# Patient Record
Sex: Female | Born: 1960 | Race: White | Hispanic: No | Marital: Married | Smoking: Never smoker
Health system: Southern US, Community
[De-identification: ages and names within clinical notes are randomized; demographics above are authoritative.]

## PROBLEM LIST (undated history)

## (undated) DIAGNOSIS — Z9221 Personal history of antineoplastic chemotherapy: Secondary | ICD-10-CM

## (undated) DIAGNOSIS — T7840XA Allergy, unspecified, initial encounter: Secondary | ICD-10-CM

## (undated) DIAGNOSIS — F419 Anxiety disorder, unspecified: Secondary | ICD-10-CM

## (undated) DIAGNOSIS — C7951 Secondary malignant neoplasm of bone: Secondary | ICD-10-CM

## (undated) DIAGNOSIS — F32A Depression, unspecified: Secondary | ICD-10-CM

## (undated) DIAGNOSIS — Z923 Personal history of irradiation: Secondary | ICD-10-CM

## (undated) DIAGNOSIS — C50919 Malignant neoplasm of unspecified site of unspecified female breast: Secondary | ICD-10-CM

## (undated) DIAGNOSIS — J45909 Unspecified asthma, uncomplicated: Secondary | ICD-10-CM

## (undated) DIAGNOSIS — K219 Gastro-esophageal reflux disease without esophagitis: Secondary | ICD-10-CM

## (undated) DIAGNOSIS — R232 Flushing: Secondary | ICD-10-CM

## (undated) DIAGNOSIS — F329 Major depressive disorder, single episode, unspecified: Secondary | ICD-10-CM

## (undated) DIAGNOSIS — M8580 Other specified disorders of bone density and structure, unspecified site: Secondary | ICD-10-CM

---

## 1996-12-04 HISTORY — PX: CHOLECYSTECTOMY: SHX55

## 1997-12-04 DIAGNOSIS — C50919 Malignant neoplasm of unspecified site of unspecified female breast: Secondary | ICD-10-CM

## 1997-12-04 HISTORY — DX: Malignant neoplasm of unspecified site of unspecified female breast: C50.919

## 2003-03-05 DIAGNOSIS — C7951 Secondary malignant neoplasm of bone: Secondary | ICD-10-CM

## 2003-03-05 DIAGNOSIS — C50919 Malignant neoplasm of unspecified site of unspecified female breast: Secondary | ICD-10-CM | POA: Insufficient documentation

## 2003-03-05 HISTORY — DX: Malignant neoplasm of unspecified site of unspecified female breast: C50.919

## 2003-04-23 HISTORY — PX: BREAST LUMPECTOMY: SHX2

## 2013-05-08 ENCOUNTER — Encounter: Payer: Self-pay | Admitting: Radiation Oncology

## 2013-05-08 DIAGNOSIS — C50919 Malignant neoplasm of unspecified site of unspecified female breast: Secondary | ICD-10-CM | POA: Insufficient documentation

## 2013-05-08 DIAGNOSIS — C7951 Secondary malignant neoplasm of bone: Secondary | ICD-10-CM | POA: Insufficient documentation

## 2013-05-08 NOTE — Progress Notes (Signed)
Histology and Location of Primary Cancer:Left Breast stage IV Dx 1999 Sites of Visceral and Bony Metastatic Disease: Sternum 4 / 2004 Location(s) of Symptomatic Metastases:Sternum Past/Anticipated chemotherapy by medical oncology, if any: 1999 Adjuvant Adriamycin and Cyclophosphamide x 4 left breast,Adjuvant Tamoxifen  apprx 2 years,d/c s/e, started on letrozole, after radiation to sternum,,Zometa started 2006,-Trastuzumab  Initiated  And continued 11/2009,was held 12/2009; reinitiated with recovery LVEF 59%,05/2010 Zometa held concerns about atypical fracture risk, 09/2012 Zometa restarted 2ndary to new  Bone fractures noted 9th,10th,ribs, no new mets reported, per 05/07/13 note  Continue to old Zometa ,continued on Trastuzumab every 3 weeks last dose  05/07/13, at [facility],    Radiation to  Left breast  1999 Marcy Panning, she believes [employer], then 2004 radiation to her sternum at [facility], [place]. Patient  has a pass port 8 years right upper inner arm for infusions,   Pain on a scale of 0-10 is: mainly in thoracic back, 4 on  1-10 pain scale,   Occasionally thoughout her body,    If Spine Met(s), symptoms, if any, include: pain upper left back   Bowel/Bladder retention or incontinence (please describe): No  Numbness or weakness in extremities (please describe):No  Current Decadron regimen, if applicable: NO  Ambulatory  SAFETY ISSUES:  Prior radiation? Yes, Left breast 1999, radiation to sternum 03/2003  Pacemaker/ICD? No  Possible current pregnancy? No  Is the patient on methotrexate?No  Current Complaints / other details:Married, no children, referred for possible palliative radiation thoracic spine lesion, mother breast cancer dx 48, living now  Age 52, total mastectomy left breast,   Menses age 1,menopause 71 with Chemotherapy, hot flashes,night sweats for years,  , depression,  Patient  lost her 52 year old cat this 2 am, no sleep  Is very upset

## 2013-05-09 ENCOUNTER — Encounter: Payer: Self-pay | Admitting: Radiation Oncology

## 2013-05-09 ENCOUNTER — Ambulatory Visit
Admission: RE | Admit: 2013-05-09 | Discharge: 2013-05-09 | Disposition: A | Discharge status: Home / Self Care | Payer: BC Managed Care – PPO | Source: Ambulatory Visit | Attending: Radiation Oncology | Admitting: Radiation Oncology

## 2013-05-09 ENCOUNTER — Telehealth: Payer: Self-pay | Admitting: *Deleted

## 2013-05-09 VITALS — Temp 98.5°F | Resp 20 | Wt 141.0 lb

## 2013-05-09 DIAGNOSIS — C7951 Secondary malignant neoplasm of bone: Secondary | ICD-10-CM

## 2013-05-09 DIAGNOSIS — M899 Disorder of bone, unspecified: Secondary | ICD-10-CM | POA: Insufficient documentation

## 2013-05-09 DIAGNOSIS — Z9221 Personal history of antineoplastic chemotherapy: Secondary | ICD-10-CM | POA: Insufficient documentation

## 2013-05-09 DIAGNOSIS — Z9089 Acquired absence of other organs: Secondary | ICD-10-CM | POA: Insufficient documentation

## 2013-05-09 DIAGNOSIS — K219 Gastro-esophageal reflux disease without esophagitis: Secondary | ICD-10-CM | POA: Insufficient documentation

## 2013-05-09 DIAGNOSIS — C50919 Malignant neoplasm of unspecified site of unspecified female breast: Secondary | ICD-10-CM | POA: Insufficient documentation

## 2013-05-09 DIAGNOSIS — Z923 Personal history of irradiation: Secondary | ICD-10-CM | POA: Insufficient documentation

## 2013-05-09 DIAGNOSIS — M549 Dorsalgia, unspecified: Secondary | ICD-10-CM | POA: Insufficient documentation

## 2013-05-09 HISTORY — DX: Malignant neoplasm of unspecified site of unspecified female breast: C50.919

## 2013-05-09 HISTORY — DX: Depression, unspecified: F32.A

## 2013-05-09 HISTORY — DX: Flushing: R23.2

## 2013-05-09 HISTORY — DX: Personal history of antineoplastic chemotherapy: Z92.21

## 2013-05-09 HISTORY — DX: Major depressive disorder, single episode, unspecified: F32.9

## 2013-05-09 HISTORY — DX: Gastro-esophageal reflux disease without esophagitis: K21.9

## 2013-05-09 HISTORY — DX: Unspecified asthma, uncomplicated: J45.909

## 2013-05-09 HISTORY — DX: Anxiety disorder, unspecified: F41.9

## 2013-05-09 HISTORY — DX: Other specified disorders of bone density and structure, unspecified site: M85.80

## 2013-05-09 HISTORY — DX: Personal history of irradiation: Z92.3

## 2013-05-09 HISTORY — DX: Secondary malignant neoplasm of bone: C79.51

## 2013-05-09 HISTORY — DX: Allergy, unspecified, initial encounter: T78.40XA

## 2013-05-09 NOTE — Progress Notes (Signed)
Please see the Nurse Progress Note in the MD Initial Consult Encounter for this patient. 

## 2013-05-09 NOTE — Telephone Encounter (Signed)
CALLED PATIENT TO INFORM OF TEST ON 05-13-13 AT [facility], SPOKE WITH PATIENT AND SHE IS AWARE OF THIS TEST.

## 2013-05-09 NOTE — Progress Notes (Addendum)
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  HISTORY OF PRESENT ILLNESS::Lisa Fisher is a 52 y.o. female  was originally diagnosed with breast cancer in 1999. From outside records the tumor was a T1 C. N0 ER/PR positive HER-2 positive breast cancer. She underwent 6 weeks of radiation and received Adriamycin and cyclophosphamide as well as adjuvant tamoxifen for 2 years. She reports a history of pain when turning her head. She had a bone scan ordered by her primary care physician which showed uptake in her sternum. She had a biopsy of that in April 2004 which confirmed an ER negative PR positive HER-2 positive cancer. She received 10-14 treatments to her sternum and was started on letrozole at that time. She has been maintained on Herceptin and Femara since that time. She was also started on Zometa which has recently been discontinued. She states her sternum continues to be painful. Radiation to the pain from about a 10 to a 3 that she still has constant soreness in that area. She underwent a fall about a year ago which caused rib fractures. She was told this was not due to cancer. For about the past year she has had pain in her mid back between her scapula. This has been worse over the past month and has developed into a constant dull ache. This is painful to palpation. She takes hydrocodone in the morning and ibuprofen throughout the day and basically just ignored it. She reports no focal weakness or numbness of her arms or her legs. She recently moved back to the area with her husband has taken a job Teacher, adult education at [employer]. She established herself the patient at [facility] with Dr. Nancie Neas. Dr. Katy Fitch ordered a bone scan which was performed  on may 27th. This showed focal uptake within the costovertebral junction of the right ninth and 10th rib consistent with fractures. The uptake was noted in the posterior aspect of the 11th rib consistent with fracture and focal increased uptake was seen in the right costovertebral junction representing a sclerotic lesion in the right lamina of T5 fracture versus metastases. There was also uptake in the sternum consistent with metastatic disease. The patient has never been told definitively that she has cancer in any other bone other than the sternum. She reports no pain anywhere else as well as denies any headaches abdominal fullness or weight loss. She has been moving her house over the past month and wonders that the increase in pain in her back is secondary to this.  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    PAST SURGICAL HISTORY: Past Surgical History  Procedure Laterality Date  . Breast lumpectomy Left 04/23/03    metastatic to sternum, with sentinel lymph node, ER/PR=+,Her2=+-invasive  . Cholecystectomy  1998    FAMILY HISTORY:  Family History  Problem Relation Age of Onset  . Cancer Mother 15    breast    SOCIAL HISTORY:  History  Substance Use Topics  . Smoking status: Never Smoker   . Smokeless tobacco: Never  Used  . Alcohol Use: 6.0 oz/week    10 Glasses of wine per week     Comment: weekly    ALLERGIES: Tape  MEDICATIONS:  Current Outpatient Prescriptions  Medication Sig Dispense Refill  . albuterol (PROVENTIL HFA;VENTOLIN HFA) 108 (90 BASE) MCG/ACT inhaler Inhale 2 puffs into the lungs every 4 (four) hours as needed for wheezing.      Marland Kitchen ascorbic acid (VITAMIN C)  500 MG tablet Take 500 mg by mouth daily.      . citalopram (CELEXA) 20 MG tablet Take 20 mg by mouth daily.      Marland Kitchen HYDROcodone-acetaminophen (NORCO) 7.5-325 MG per tablet Take 1 tablet by mouth every 6 (six) hours as needed for pain.      Marland Kitchen letrozole (FEMARA) 2.5 MG tablet Take 2.5 mg by mouth daily.      Marland Kitchen omeprazole (PRILOSEC) 20 MG capsule Take 20 mg by mouth daily.       No current facility-administered medications for this encounter.    REVIEW OF SYSTEMS:  A 15 point review of systems is documented in the electronic medical record. This was obtained by the nursing staff. However, I reviewed this with the patient to discuss relevant findings and make appropriate changes.  Pertinent items are noted in HPI.   PHYSICAL EXAM:  Filed Vitals:   05/09/13 1010  Temp: 98.5 F (36.9 C)  Resp: 20  .141 lb (63.957 kg). He is a pleasant female in no distress sitting comfortably on examining table. She is alert and a x3. She has tenderness to palpation over her mid back. She has tenderness to palpation over the sternum. She is 5 out of 5 strength in her bilateral upper and lower extremities.  LABORATORY DATA:  No results found for this basename: WBC, HGB, HCT, MCV, PLT   No results found for this basename: NA, K, CL, CO2   No results found for this basename: ALT, AST, GGT, ALKPHOS, BILITOT     RADIOGRAPHY: No results found.    IMPRESSION: 52 year old female with a history of a solitary metastases to the sternum with pain and a questionable bone scan finding at T5  PLAN: I talked to the patient today. She is familiar with the effects of radiation and decreasing pain due to metastatic disease. At this point I am not sure that she has metastatic disease. I called radiology at [facility] and asked them to review the bone scan in comparison to her outside studies. It's really unclear what is going on with this lesion at T5. For that reason I have recommended further imaging with an MRI. This  should give [place] an idea as there are metastatic foci in the spine and particularly if this area is associated with a latency. She is debating whether to continue Herceptin or to stop. It she has developed other areas of bony metastatic disease while on Herceptin that could inform her decision on whether to stop or continue Herceptin. It would also make me feel more comfortable in radiating as right now I don't have a clear indication that she has malignancy and therefore don't know that she will respond to radiation treatment to this area. I discussed this with Dr. Lyman Bishop as well as with the patient in great detail. She is scheduled for an MRI of the spine next Tuesday at [facility]. We will followup on those results. She does have malignancy we discussed 10 treatments as an outpatient. We'll of course have to be wary of her  previous treatment to the sternum as well as her breast. Probably look at a posterior wedge pair arrangement.  I spent 40 minutes  face to face with the patient and more than 50% of that time was spent in counseling and/or coordination of care.   ------------------------------------------------  Lurline Hare, MD   ADDENDUM: the MRI of her spine revealed a likely degenerative change at the area of concern at T4 and T5. No other evidence of metastatic disease was noted. In the posterior left fifth rib a 5 mm lesion was present which did enhance with contrast. No correlate was present in the bone scan of may. Trauma to this area could also be an expectation. I also received her outside medical records and have reviewed these. On a bone scan of March of this year uptake was noted at right T5 corresponding to degenerative change. All in all I think we can wean that towards her abnormalities on bone scan being degenerative in nature. There is not a role for radiation in the treatment of degenerative disease. I will contact her medical oncologist. I have not scheduled her followup with  me. Be happy to see her back on a when necessary basis if she requires radiation for pain control or any related issue.

## 2013-05-12 ENCOUNTER — Encounter: Payer: Self-pay | Admitting: Radiation Oncology

## 2013-05-12 NOTE — Progress Notes (Signed)
Per Blue [facility], no precert required for diagnostic imaging procedures for patient's group [phone]

## 2013-05-14 ENCOUNTER — Telehealth: Payer: Self-pay | Admitting: *Deleted

## 2013-05-14 ENCOUNTER — Encounter: Payer: Self-pay | Admitting: *Deleted

## 2013-05-14 NOTE — Telephone Encounter (Signed)
MRI results thoracic spine from [facility] done 05/13/13 printed  And   Results given  to Dr.Wenworth on phone, called patient and levft message for return call 4:02 PM Patient returned call, gave results per Dr.Wentworth no evidence of cancer in her spine, no radiation  To her spine, will continue to monitor her ribs and when MD is back this coming Tuesday she will call Dr. Lyman Bishop, patient thanked MD and this RN for the report 4:06 PM

## 2013-05-20 NOTE — Addendum Note (Signed)
Encounter addended by: Lurline Hare, MD on: 05/20/2013 12:57 PM<BR>     Documentation filed: Visit Diagnoses, Clinical Notes, Notes Section

## 2013-05-21 ENCOUNTER — Telehealth: Payer: Self-pay | Admitting: *Deleted

## 2013-05-21 NOTE — Telephone Encounter (Signed)
Patient called asking if Dr.Wentorth had called Dr.Lawrence and spoke with him and if their is any plans for any treatments? If no cancer then why is she  In pain still, informed her that I would speak with MD today when she is back in office, pateint asked to be called back  11:06 AM

## 2013-05-21 NOTE — Telephone Encounter (Signed)
Pain is likely due to inflammation or musculoskeletal related injury.  Suggest follow up with PCP or possible referral to orthopedic surgeon.  I have emailed Dr. Lyman Bishop but have not heard back. SW

## 2013-05-22 ENCOUNTER — Telehealth: Payer: Self-pay | Admitting: Radiation Oncology

## 2013-05-22 ENCOUNTER — Telehealth: Payer: Self-pay | Admitting: *Deleted

## 2013-05-22 NOTE — Telephone Encounter (Signed)
Discussed MRI with Dr. Lyman Bishop.  Discussed referral to ortho or PCP for pain management. No RT.

## 2013-05-22 NOTE — Telephone Encounter (Signed)
Called patient and relayed information from Dr.Wentworth that pain is likely due to inflammation or musculoskeletal related injury and she should follow up with PCP  Or possible Orthopedic surgeon, Md did e-mail Dr.Lawrence but hasn't heard back as yet, patient thanked MD and me for return call 8:46 AM

## 2013-05-23 ENCOUNTER — Ambulatory Visit: Payer: Self-pay | Admitting: Radiation Oncology

## 2013-05-23 ENCOUNTER — Ambulatory Visit: Payer: Self-pay

## 2016-03-17 DIAGNOSIS — C50112 Malignant neoplasm of central portion of left female breast: Secondary | ICD-10-CM | POA: Diagnosis not present

## 2016-03-17 DIAGNOSIS — Z5111 Encounter for antineoplastic chemotherapy: Secondary | ICD-10-CM | POA: Diagnosis not present

## 2016-04-05 DIAGNOSIS — R918 Other nonspecific abnormal finding of lung field: Secondary | ICD-10-CM | POA: Diagnosis not present

## 2016-04-05 DIAGNOSIS — C50112 Malignant neoplasm of central portion of left female breast: Secondary | ICD-10-CM | POA: Diagnosis not present

## 2016-04-05 DIAGNOSIS — M8928 Other disorders of bone development and growth, other site: Secondary | ICD-10-CM | POA: Diagnosis not present

## 2016-04-07 DIAGNOSIS — Z79899 Other long term (current) drug therapy: Secondary | ICD-10-CM | POA: Diagnosis not present

## 2016-04-07 DIAGNOSIS — C7951 Secondary malignant neoplasm of bone: Secondary | ICD-10-CM | POA: Diagnosis not present

## 2016-04-07 DIAGNOSIS — K5900 Constipation, unspecified: Secondary | ICD-10-CM | POA: Diagnosis not present

## 2016-04-07 DIAGNOSIS — C50112 Malignant neoplasm of central portion of left female breast: Secondary | ICD-10-CM | POA: Diagnosis not present

## 2016-04-07 DIAGNOSIS — Z5111 Encounter for antineoplastic chemotherapy: Secondary | ICD-10-CM | POA: Diagnosis not present

## 2016-04-07 DIAGNOSIS — R0789 Other chest pain: Secondary | ICD-10-CM | POA: Diagnosis not present

## 2016-04-07 DIAGNOSIS — F329 Major depressive disorder, single episode, unspecified: Secondary | ICD-10-CM | POA: Diagnosis not present

## 2016-04-07 DIAGNOSIS — Z79811 Long term (current) use of aromatase inhibitors: Secondary | ICD-10-CM | POA: Diagnosis not present

## 2016-05-05 DIAGNOSIS — C50112 Malignant neoplasm of central portion of left female breast: Secondary | ICD-10-CM | POA: Diagnosis not present

## 2016-05-05 DIAGNOSIS — Z5111 Encounter for antineoplastic chemotherapy: Secondary | ICD-10-CM | POA: Diagnosis not present

## 2016-05-26 DIAGNOSIS — Z5111 Encounter for antineoplastic chemotherapy: Secondary | ICD-10-CM | POA: Diagnosis not present

## 2016-05-26 DIAGNOSIS — C50112 Malignant neoplasm of central portion of left female breast: Secondary | ICD-10-CM | POA: Diagnosis not present

## 2016-06-13 DIAGNOSIS — F329 Major depressive disorder, single episode, unspecified: Secondary | ICD-10-CM | POA: Diagnosis not present

## 2016-06-13 DIAGNOSIS — I10 Essential (primary) hypertension: Secondary | ICD-10-CM | POA: Diagnosis not present

## 2016-06-13 DIAGNOSIS — K5903 Drug induced constipation: Secondary | ICD-10-CM | POA: Diagnosis not present

## 2016-06-13 DIAGNOSIS — T402X5A Adverse effect of other opioids, initial encounter: Secondary | ICD-10-CM | POA: Diagnosis not present

## 2016-06-16 DIAGNOSIS — C50112 Malignant neoplasm of central portion of left female breast: Secondary | ICD-10-CM | POA: Diagnosis not present

## 2016-06-16 DIAGNOSIS — Z5111 Encounter for antineoplastic chemotherapy: Secondary | ICD-10-CM | POA: Diagnosis not present

## 2016-07-05 DIAGNOSIS — I071 Rheumatic tricuspid insufficiency: Secondary | ICD-10-CM | POA: Diagnosis not present

## 2016-07-05 DIAGNOSIS — C50112 Malignant neoplasm of central portion of left female breast: Secondary | ICD-10-CM | POA: Diagnosis not present

## 2016-07-05 DIAGNOSIS — I272 Other secondary pulmonary hypertension: Secondary | ICD-10-CM | POA: Diagnosis not present

## 2016-07-07 DIAGNOSIS — C7951 Secondary malignant neoplasm of bone: Secondary | ICD-10-CM | POA: Diagnosis not present

## 2016-07-07 DIAGNOSIS — Z171 Estrogen receptor negative status [ER-]: Secondary | ICD-10-CM | POA: Diagnosis not present

## 2016-07-07 DIAGNOSIS — C50112 Malignant neoplasm of central portion of left female breast: Secondary | ICD-10-CM | POA: Diagnosis not present

## 2016-07-07 DIAGNOSIS — G893 Neoplasm related pain (acute) (chronic): Secondary | ICD-10-CM | POA: Diagnosis not present

## 2016-07-07 DIAGNOSIS — F329 Major depressive disorder, single episode, unspecified: Secondary | ICD-10-CM | POA: Diagnosis not present

## 2016-07-07 DIAGNOSIS — Z79899 Other long term (current) drug therapy: Secondary | ICD-10-CM | POA: Diagnosis not present

## 2016-07-07 DIAGNOSIS — Z5111 Encounter for antineoplastic chemotherapy: Secondary | ICD-10-CM | POA: Diagnosis not present

## 2016-07-07 DIAGNOSIS — K5900 Constipation, unspecified: Secondary | ICD-10-CM | POA: Diagnosis not present

## 2016-07-07 DIAGNOSIS — Z79811 Long term (current) use of aromatase inhibitors: Secondary | ICD-10-CM | POA: Diagnosis not present

## 2016-07-28 DIAGNOSIS — Z5111 Encounter for antineoplastic chemotherapy: Secondary | ICD-10-CM | POA: Diagnosis not present

## 2016-07-28 DIAGNOSIS — C50112 Malignant neoplasm of central portion of left female breast: Secondary | ICD-10-CM | POA: Diagnosis not present

## 2016-08-18 DIAGNOSIS — C50112 Malignant neoplasm of central portion of left female breast: Secondary | ICD-10-CM | POA: Diagnosis not present

## 2016-08-18 DIAGNOSIS — Z5111 Encounter for antineoplastic chemotherapy: Secondary | ICD-10-CM | POA: Diagnosis not present

## 2016-09-08 DIAGNOSIS — C50112 Malignant neoplasm of central portion of left female breast: Secondary | ICD-10-CM | POA: Diagnosis not present

## 2016-09-08 DIAGNOSIS — C7951 Secondary malignant neoplasm of bone: Secondary | ICD-10-CM | POA: Diagnosis not present

## 2016-09-08 DIAGNOSIS — Z5111 Encounter for antineoplastic chemotherapy: Secondary | ICD-10-CM | POA: Diagnosis not present

## 2016-09-29 DIAGNOSIS — C50112 Malignant neoplasm of central portion of left female breast: Secondary | ICD-10-CM | POA: Diagnosis not present

## 2016-09-29 DIAGNOSIS — Z23 Encounter for immunization: Secondary | ICD-10-CM | POA: Diagnosis not present

## 2016-09-29 DIAGNOSIS — C7951 Secondary malignant neoplasm of bone: Secondary | ICD-10-CM | POA: Diagnosis not present

## 2016-09-29 DIAGNOSIS — Z5111 Encounter for antineoplastic chemotherapy: Secondary | ICD-10-CM | POA: Diagnosis not present

## 2016-10-13 DIAGNOSIS — C7951 Secondary malignant neoplasm of bone: Secondary | ICD-10-CM | POA: Diagnosis not present

## 2016-10-13 DIAGNOSIS — I071 Rheumatic tricuspid insufficiency: Secondary | ICD-10-CM | POA: Diagnosis not present

## 2016-10-13 DIAGNOSIS — I272 Pulmonary hypertension, unspecified: Secondary | ICD-10-CM | POA: Diagnosis not present

## 2016-10-13 DIAGNOSIS — K219 Gastro-esophageal reflux disease without esophagitis: Secondary | ICD-10-CM | POA: Diagnosis not present

## 2016-10-13 DIAGNOSIS — R590 Localized enlarged lymph nodes: Secondary | ICD-10-CM | POA: Diagnosis not present

## 2016-10-13 DIAGNOSIS — C50112 Malignant neoplasm of central portion of left female breast: Secondary | ICD-10-CM | POA: Diagnosis not present

## 2016-10-13 DIAGNOSIS — R918 Other nonspecific abnormal finding of lung field: Secondary | ICD-10-CM | POA: Diagnosis not present

## 2016-10-20 DIAGNOSIS — G893 Neoplasm related pain (acute) (chronic): Secondary | ICD-10-CM | POA: Diagnosis not present

## 2016-10-20 DIAGNOSIS — C50112 Malignant neoplasm of central portion of left female breast: Secondary | ICD-10-CM | POA: Diagnosis not present

## 2016-10-20 DIAGNOSIS — K5900 Constipation, unspecified: Secondary | ICD-10-CM | POA: Diagnosis not present

## 2016-10-20 DIAGNOSIS — Z79899 Other long term (current) drug therapy: Secondary | ICD-10-CM | POA: Diagnosis not present

## 2016-10-20 DIAGNOSIS — F329 Major depressive disorder, single episode, unspecified: Secondary | ICD-10-CM | POA: Diagnosis not present

## 2016-10-20 DIAGNOSIS — Z5111 Encounter for antineoplastic chemotherapy: Secondary | ICD-10-CM | POA: Diagnosis not present

## 2016-10-20 DIAGNOSIS — Z171 Estrogen receptor negative status [ER-]: Secondary | ICD-10-CM | POA: Diagnosis not present

## 2016-10-20 DIAGNOSIS — C7951 Secondary malignant neoplasm of bone: Secondary | ICD-10-CM | POA: Diagnosis not present

## 2016-10-20 DIAGNOSIS — Z79811 Long term (current) use of aromatase inhibitors: Secondary | ICD-10-CM | POA: Diagnosis not present

## 2016-11-10 DIAGNOSIS — Z171 Estrogen receptor negative status [ER-]: Secondary | ICD-10-CM | POA: Diagnosis not present

## 2016-11-10 DIAGNOSIS — Z5111 Encounter for antineoplastic chemotherapy: Secondary | ICD-10-CM | POA: Diagnosis not present

## 2016-11-10 DIAGNOSIS — C50112 Malignant neoplasm of central portion of left female breast: Secondary | ICD-10-CM | POA: Diagnosis not present

## 2016-12-01 DIAGNOSIS — Z171 Estrogen receptor negative status [ER-]: Secondary | ICD-10-CM | POA: Diagnosis not present

## 2016-12-01 DIAGNOSIS — C50112 Malignant neoplasm of central portion of left female breast: Secondary | ICD-10-CM | POA: Diagnosis not present

## 2016-12-01 DIAGNOSIS — Z5111 Encounter for antineoplastic chemotherapy: Secondary | ICD-10-CM | POA: Diagnosis not present

## 2016-12-14 DIAGNOSIS — R0989 Other specified symptoms and signs involving the circulatory and respiratory systems: Secondary | ICD-10-CM | POA: Diagnosis not present

## 2016-12-14 DIAGNOSIS — F329 Major depressive disorder, single episode, unspecified: Secondary | ICD-10-CM | POA: Diagnosis not present

## 2016-12-14 DIAGNOSIS — C50919 Malignant neoplasm of unspecified site of unspecified female breast: Secondary | ICD-10-CM | POA: Diagnosis not present

## 2016-12-14 DIAGNOSIS — I10 Essential (primary) hypertension: Secondary | ICD-10-CM | POA: Diagnosis not present

## 2016-12-22 DIAGNOSIS — Z171 Estrogen receptor negative status [ER-]: Secondary | ICD-10-CM | POA: Diagnosis not present

## 2016-12-22 DIAGNOSIS — Z5112 Encounter for antineoplastic immunotherapy: Secondary | ICD-10-CM | POA: Diagnosis not present

## 2016-12-22 DIAGNOSIS — C50112 Malignant neoplasm of central portion of left female breast: Secondary | ICD-10-CM | POA: Diagnosis not present

## 2017-01-05 DIAGNOSIS — Z171 Estrogen receptor negative status [ER-]: Secondary | ICD-10-CM | POA: Diagnosis not present

## 2017-01-05 DIAGNOSIS — M898X8 Other specified disorders of bone, other site: Secondary | ICD-10-CM | POA: Diagnosis not present

## 2017-01-05 DIAGNOSIS — C50112 Malignant neoplasm of central portion of left female breast: Secondary | ICD-10-CM | POA: Diagnosis not present

## 2017-01-12 DIAGNOSIS — G893 Neoplasm related pain (acute) (chronic): Secondary | ICD-10-CM | POA: Diagnosis not present

## 2017-01-12 DIAGNOSIS — Z171 Estrogen receptor negative status [ER-]: Secondary | ICD-10-CM | POA: Diagnosis not present

## 2017-01-12 DIAGNOSIS — Z5112 Encounter for antineoplastic immunotherapy: Secondary | ICD-10-CM | POA: Diagnosis not present

## 2017-01-12 DIAGNOSIS — C7951 Secondary malignant neoplasm of bone: Secondary | ICD-10-CM | POA: Diagnosis not present

## 2017-01-12 DIAGNOSIS — K5900 Constipation, unspecified: Secondary | ICD-10-CM | POA: Diagnosis not present

## 2017-01-12 DIAGNOSIS — F329 Major depressive disorder, single episode, unspecified: Secondary | ICD-10-CM | POA: Diagnosis not present

## 2017-01-12 DIAGNOSIS — C50912 Malignant neoplasm of unspecified site of left female breast: Secondary | ICD-10-CM | POA: Diagnosis not present

## 2017-01-12 DIAGNOSIS — C50112 Malignant neoplasm of central portion of left female breast: Secondary | ICD-10-CM | POA: Diagnosis not present

## 2017-02-02 DIAGNOSIS — Z170 Estrogen receptor positive status [ER+]: Secondary | ICD-10-CM | POA: Diagnosis not present

## 2017-02-02 DIAGNOSIS — C50112 Malignant neoplasm of central portion of left female breast: Secondary | ICD-10-CM | POA: Diagnosis not present

## 2017-02-02 DIAGNOSIS — Z5112 Encounter for antineoplastic immunotherapy: Secondary | ICD-10-CM | POA: Diagnosis not present

## 2017-02-23 DIAGNOSIS — Z170 Estrogen receptor positive status [ER+]: Secondary | ICD-10-CM | POA: Diagnosis not present

## 2017-02-23 DIAGNOSIS — C50112 Malignant neoplasm of central portion of left female breast: Secondary | ICD-10-CM | POA: Diagnosis not present

## 2017-02-23 DIAGNOSIS — Z5112 Encounter for antineoplastic immunotherapy: Secondary | ICD-10-CM | POA: Diagnosis not present

## 2017-03-16 DIAGNOSIS — C50112 Malignant neoplasm of central portion of left female breast: Secondary | ICD-10-CM | POA: Diagnosis not present

## 2017-03-16 DIAGNOSIS — Z5112 Encounter for antineoplastic immunotherapy: Secondary | ICD-10-CM | POA: Diagnosis not present

## 2017-04-02 DIAGNOSIS — Z171 Estrogen receptor negative status [ER-]: Secondary | ICD-10-CM | POA: Diagnosis not present

## 2017-04-02 DIAGNOSIS — C7951 Secondary malignant neoplasm of bone: Secondary | ICD-10-CM | POA: Diagnosis not present

## 2017-04-02 DIAGNOSIS — C50112 Malignant neoplasm of central portion of left female breast: Secondary | ICD-10-CM | POA: Diagnosis not present

## 2017-04-06 DIAGNOSIS — K5900 Constipation, unspecified: Secondary | ICD-10-CM | POA: Diagnosis not present

## 2017-04-06 DIAGNOSIS — C50112 Malignant neoplasm of central portion of left female breast: Secondary | ICD-10-CM | POA: Diagnosis not present

## 2017-04-06 DIAGNOSIS — G893 Neoplasm related pain (acute) (chronic): Secondary | ICD-10-CM | POA: Diagnosis not present

## 2017-04-06 DIAGNOSIS — F329 Major depressive disorder, single episode, unspecified: Secondary | ICD-10-CM | POA: Diagnosis not present

## 2017-04-06 DIAGNOSIS — C7951 Secondary malignant neoplasm of bone: Secondary | ICD-10-CM | POA: Diagnosis not present

## 2017-04-06 DIAGNOSIS — Z171 Estrogen receptor negative status [ER-]: Secondary | ICD-10-CM | POA: Diagnosis not present

## 2017-04-06 DIAGNOSIS — Z170 Estrogen receptor positive status [ER+]: Secondary | ICD-10-CM | POA: Diagnosis not present

## 2017-04-06 DIAGNOSIS — C50912 Malignant neoplasm of unspecified site of left female breast: Secondary | ICD-10-CM | POA: Diagnosis not present

## 2017-04-16 DIAGNOSIS — Z853 Personal history of malignant neoplasm of breast: Secondary | ICD-10-CM | POA: Diagnosis not present

## 2017-04-16 DIAGNOSIS — C799 Secondary malignant neoplasm of unspecified site: Secondary | ICD-10-CM | POA: Diagnosis not present

## 2017-04-27 DIAGNOSIS — C799 Secondary malignant neoplasm of unspecified site: Secondary | ICD-10-CM | POA: Diagnosis not present

## 2017-04-27 DIAGNOSIS — Z5112 Encounter for antineoplastic immunotherapy: Secondary | ICD-10-CM | POA: Diagnosis not present

## 2017-04-27 DIAGNOSIS — D242 Benign neoplasm of left breast: Secondary | ICD-10-CM | POA: Diagnosis not present

## 2017-04-27 DIAGNOSIS — C50112 Malignant neoplasm of central portion of left female breast: Secondary | ICD-10-CM | POA: Diagnosis not present

## 2017-05-15 DIAGNOSIS — C7951 Secondary malignant neoplasm of bone: Secondary | ICD-10-CM | POA: Diagnosis not present

## 2017-05-18 DIAGNOSIS — Z170 Estrogen receptor positive status [ER+]: Secondary | ICD-10-CM | POA: Diagnosis not present

## 2017-05-18 DIAGNOSIS — C50112 Malignant neoplasm of central portion of left female breast: Secondary | ICD-10-CM | POA: Diagnosis not present

## 2017-05-18 DIAGNOSIS — Z5111 Encounter for antineoplastic chemotherapy: Secondary | ICD-10-CM | POA: Diagnosis not present

## 2017-05-18 DIAGNOSIS — Z79899 Other long term (current) drug therapy: Secondary | ICD-10-CM | POA: Diagnosis not present

## 2017-06-02 DIAGNOSIS — R0789 Other chest pain: Secondary | ICD-10-CM | POA: Diagnosis not present

## 2017-06-02 DIAGNOSIS — C7951 Secondary malignant neoplasm of bone: Secondary | ICD-10-CM | POA: Diagnosis not present

## 2017-06-08 DIAGNOSIS — C7951 Secondary malignant neoplasm of bone: Secondary | ICD-10-CM | POA: Diagnosis not present

## 2017-06-08 DIAGNOSIS — Z5111 Encounter for antineoplastic chemotherapy: Secondary | ICD-10-CM | POA: Diagnosis not present

## 2017-06-08 DIAGNOSIS — Z79899 Other long term (current) drug therapy: Secondary | ICD-10-CM | POA: Diagnosis not present

## 2017-06-08 DIAGNOSIS — G893 Neoplasm related pain (acute) (chronic): Secondary | ICD-10-CM | POA: Diagnosis not present

## 2017-06-08 DIAGNOSIS — Z171 Estrogen receptor negative status [ER-]: Secondary | ICD-10-CM | POA: Diagnosis not present

## 2017-06-08 DIAGNOSIS — C50112 Malignant neoplasm of central portion of left female breast: Secondary | ICD-10-CM | POA: Diagnosis not present

## 2017-06-13 DIAGNOSIS — C792 Secondary malignant neoplasm of skin: Secondary | ICD-10-CM | POA: Diagnosis not present

## 2017-06-13 DIAGNOSIS — C50919 Malignant neoplasm of unspecified site of unspecified female breast: Secondary | ICD-10-CM | POA: Diagnosis not present

## 2017-06-13 DIAGNOSIS — Z923 Personal history of irradiation: Secondary | ICD-10-CM | POA: Diagnosis not present

## 2017-06-13 DIAGNOSIS — Z9889 Other specified postprocedural states: Secondary | ICD-10-CM | POA: Diagnosis not present

## 2017-06-13 DIAGNOSIS — Z171 Estrogen receptor negative status [ER-]: Secondary | ICD-10-CM | POA: Diagnosis not present

## 2017-06-13 DIAGNOSIS — C7951 Secondary malignant neoplasm of bone: Secondary | ICD-10-CM | POA: Diagnosis not present

## 2017-06-18 DIAGNOSIS — R0789 Other chest pain: Secondary | ICD-10-CM | POA: Diagnosis not present

## 2017-06-18 DIAGNOSIS — C7951 Secondary malignant neoplasm of bone: Secondary | ICD-10-CM | POA: Diagnosis not present

## 2017-06-21 DIAGNOSIS — K5903 Drug induced constipation: Secondary | ICD-10-CM | POA: Diagnosis not present

## 2017-06-21 DIAGNOSIS — F329 Major depressive disorder, single episode, unspecified: Secondary | ICD-10-CM | POA: Diagnosis not present

## 2017-06-21 DIAGNOSIS — C50919 Malignant neoplasm of unspecified site of unspecified female breast: Secondary | ICD-10-CM | POA: Diagnosis not present

## 2017-06-21 DIAGNOSIS — I10 Essential (primary) hypertension: Secondary | ICD-10-CM | POA: Diagnosis not present

## 2017-06-25 DIAGNOSIS — C50919 Malignant neoplasm of unspecified site of unspecified female breast: Secondary | ICD-10-CM | POA: Diagnosis not present

## 2017-06-25 DIAGNOSIS — C7951 Secondary malignant neoplasm of bone: Secondary | ICD-10-CM | POA: Diagnosis not present

## 2017-06-26 DIAGNOSIS — R0789 Other chest pain: Secondary | ICD-10-CM | POA: Diagnosis not present

## 2017-06-26 DIAGNOSIS — C7951 Secondary malignant neoplasm of bone: Secondary | ICD-10-CM | POA: Diagnosis not present

## 2017-07-17 DIAGNOSIS — Z5112 Encounter for antineoplastic immunotherapy: Secondary | ICD-10-CM | POA: Diagnosis not present

## 2017-07-17 DIAGNOSIS — T8089XA Other complications following infusion, transfusion and therapeutic injection, initial encounter: Secondary | ICD-10-CM | POA: Diagnosis not present

## 2017-07-17 DIAGNOSIS — C50912 Malignant neoplasm of unspecified site of left female breast: Secondary | ICD-10-CM | POA: Diagnosis not present

## 2017-07-17 DIAGNOSIS — Z5111 Encounter for antineoplastic chemotherapy: Secondary | ICD-10-CM | POA: Diagnosis not present

## 2017-07-17 DIAGNOSIS — R0789 Other chest pain: Secondary | ICD-10-CM | POA: Diagnosis not present

## 2017-07-17 DIAGNOSIS — C7951 Secondary malignant neoplasm of bone: Secondary | ICD-10-CM | POA: Diagnosis not present

## 2017-07-25 DIAGNOSIS — K121 Other forms of stomatitis: Secondary | ICD-10-CM | POA: Diagnosis not present

## 2017-07-26 ENCOUNTER — Encounter: Payer: Self-pay | Admitting: Emergency Medicine

## 2017-07-26 ENCOUNTER — Emergency Department (HOSPITAL_COMMUNITY): Payer: BLUE CROSS/BLUE SHIELD

## 2017-07-26 ENCOUNTER — Inpatient Hospital Stay (HOSPITAL_COMMUNITY)
Admission: EM | Admit: 2017-07-26 | Discharge: 2017-07-28 | DRG: 158 | Disposition: A | Discharge status: Home / Self Care | Payer: BLUE CROSS/BLUE SHIELD | Attending: Family Medicine | Admitting: Family Medicine

## 2017-07-26 DIAGNOSIS — R509 Fever, unspecified: Secondary | ICD-10-CM | POA: Diagnosis not present

## 2017-07-26 DIAGNOSIS — R7881 Bacteremia: Secondary | ICD-10-CM | POA: Diagnosis not present

## 2017-07-26 DIAGNOSIS — B009 Herpesviral infection, unspecified: Secondary | ICD-10-CM | POA: Diagnosis present

## 2017-07-26 DIAGNOSIS — T451X5A Adverse effect of antineoplastic and immunosuppressive drugs, initial encounter: Secondary | ICD-10-CM | POA: Diagnosis present

## 2017-07-26 DIAGNOSIS — Z79899 Other long term (current) drug therapy: Secondary | ICD-10-CM

## 2017-07-26 DIAGNOSIS — Z91048 Other nonmedicinal substance allergy status: Secondary | ICD-10-CM | POA: Diagnosis not present

## 2017-07-26 DIAGNOSIS — J45909 Unspecified asthma, uncomplicated: Secondary | ICD-10-CM | POA: Diagnosis present

## 2017-07-26 DIAGNOSIS — D84821 Immunodeficiency due to drugs: Secondary | ICD-10-CM

## 2017-07-26 DIAGNOSIS — R5081 Fever presenting with conditions classified elsewhere: Secondary | ICD-10-CM | POA: Diagnosis present

## 2017-07-26 DIAGNOSIS — M8580 Other specified disorders of bone density and structure, unspecified site: Secondary | ICD-10-CM | POA: Diagnosis present

## 2017-07-26 DIAGNOSIS — Z9221 Personal history of antineoplastic chemotherapy: Secondary | ICD-10-CM

## 2017-07-26 DIAGNOSIS — D649 Anemia, unspecified: Secondary | ICD-10-CM | POA: Diagnosis not present

## 2017-07-26 DIAGNOSIS — D709 Neutropenia, unspecified: Secondary | ICD-10-CM

## 2017-07-26 DIAGNOSIS — F32A Depression, unspecified: Secondary | ICD-10-CM

## 2017-07-26 DIAGNOSIS — K219 Gastro-esophageal reflux disease without esophagitis: Secondary | ICD-10-CM | POA: Diagnosis not present

## 2017-07-26 DIAGNOSIS — K1231 Oral mucositis (ulcerative) due to antineoplastic therapy: Secondary | ICD-10-CM | POA: Diagnosis not present

## 2017-07-26 DIAGNOSIS — D899 Disorder involving the immune mechanism, unspecified: Secondary | ICD-10-CM | POA: Diagnosis present

## 2017-07-26 DIAGNOSIS — Z170 Estrogen receptor positive status [ER+]: Secondary | ICD-10-CM | POA: Diagnosis not present

## 2017-07-26 DIAGNOSIS — C7951 Secondary malignant neoplasm of bone: Secondary | ICD-10-CM | POA: Diagnosis not present

## 2017-07-26 DIAGNOSIS — C50912 Malignant neoplasm of unspecified site of left female breast: Secondary | ICD-10-CM | POA: Diagnosis not present

## 2017-07-26 DIAGNOSIS — E876 Hypokalemia: Secondary | ICD-10-CM

## 2017-07-26 DIAGNOSIS — F419 Anxiety disorder, unspecified: Secondary | ICD-10-CM | POA: Diagnosis present

## 2017-07-26 DIAGNOSIS — F329 Major depressive disorder, single episode, unspecified: Secondary | ICD-10-CM | POA: Diagnosis present

## 2017-07-26 DIAGNOSIS — Z881 Allergy status to other antibiotic agents status: Secondary | ICD-10-CM

## 2017-07-26 DIAGNOSIS — Z923 Personal history of irradiation: Secondary | ICD-10-CM | POA: Diagnosis not present

## 2017-07-26 DIAGNOSIS — L089 Local infection of the skin and subcutaneous tissue, unspecified: Secondary | ICD-10-CM | POA: Diagnosis not present

## 2017-07-26 DIAGNOSIS — Z886 Allergy status to analgesic agent status: Secondary | ICD-10-CM

## 2017-07-26 DIAGNOSIS — Z79891 Long term (current) use of opiate analgesic: Secondary | ICD-10-CM

## 2017-07-26 DIAGNOSIS — I10 Essential (primary) hypertension: Secondary | ICD-10-CM | POA: Diagnosis present

## 2017-07-26 DIAGNOSIS — Z515 Encounter for palliative care: Secondary | ICD-10-CM | POA: Diagnosis present

## 2017-07-26 DIAGNOSIS — D72819 Decreased white blood cell count, unspecified: Secondary | ICD-10-CM | POA: Diagnosis not present

## 2017-07-26 LAB — URINALYSIS, ROUTINE W REFLEX MICROSCOPIC
BILIRUBIN URINE: NEGATIVE
GLUCOSE, UA: NEGATIVE mg/dL
Hgb urine dipstick: NEGATIVE
KETONES UR: 5 mg/dL — AB
Nitrite: NEGATIVE
PH: 5 (ref 5.0–8.0)
PROTEIN: 30 mg/dL — AB
SPECIFIC GRAVITY, URINE: 1.027 (ref 1.005–1.030)

## 2017-07-26 LAB — COMPREHENSIVE METABOLIC PANEL
ALT: 17 U/L (ref 14–54)
AST: 27 U/L (ref 15–41)
Albumin: 3.9 g/dL (ref 3.5–5.0)
Alkaline Phosphatase: 84 U/L (ref 38–126)
Anion gap: 13 (ref 5–15)
BUN: 13 mg/dL (ref 6–20)
CALCIUM: 8.8 mg/dL — AB (ref 8.9–10.3)
CHLORIDE: 95 mmol/L — AB (ref 101–111)
CO2: 24 mmol/L (ref 22–32)
Creatinine, Ser: 0.6 mg/dL (ref 0.44–1.00)
Glucose, Bld: 106 mg/dL — ABNORMAL HIGH (ref 65–99)
Potassium: 2.7 mmol/L — CL (ref 3.5–5.1)
Sodium: 132 mmol/L — ABNORMAL LOW (ref 135–145)
Total Bilirubin: 0.5 mg/dL (ref 0.3–1.2)
Total Protein: 7.6 g/dL (ref 6.5–8.1)

## 2017-07-26 LAB — BASIC METABOLIC PANEL
Anion gap: 7 (ref 5–15)
BUN: 10 mg/dL (ref 6–20)
CALCIUM: 7.9 mg/dL — AB (ref 8.9–10.3)
CO2: 23 mmol/L (ref 22–32)
Chloride: 102 mmol/L (ref 101–111)
Creatinine, Ser: 0.4 mg/dL — ABNORMAL LOW (ref 0.44–1.00)
GLUCOSE: 146 mg/dL — AB (ref 65–99)
Potassium: 3.2 mmol/L — ABNORMAL LOW (ref 3.5–5.1)
Sodium: 132 mmol/L — ABNORMAL LOW (ref 135–145)

## 2017-07-26 LAB — CBC WITH DIFFERENTIAL/PLATELET
BASOS ABS: 0 10*3/uL (ref 0.0–0.1)
Basophils Relative: 0 %
EOS PCT: 1 %
Eosinophils Absolute: 0 10*3/uL (ref 0.0–0.7)
HEMATOCRIT: 31 % — AB (ref 36.0–46.0)
Hemoglobin: 10.7 g/dL — ABNORMAL LOW (ref 12.0–15.0)
LYMPHS ABS: 0.6 10*3/uL — AB (ref 0.7–4.0)
Lymphocytes Relative: 19 %
MCH: 32.4 pg (ref 26.0–34.0)
MCHC: 34.5 g/dL (ref 30.0–36.0)
MCV: 93.9 fL (ref 78.0–100.0)
METAMYELOCYTES PCT: 1 %
MONOS PCT: 46 %
MYELOCYTES: 10 %
Monocytes Absolute: 1.4 10*3/uL — ABNORMAL HIGH (ref 0.1–1.0)
NRBC: 3 /100{WBCs} — AB
Neutro Abs: 1 10*3/uL — ABNORMAL LOW (ref 1.7–7.7)
Neutrophils Relative %: 23 %
Platelets: 242 10*3/uL (ref 150–400)
RBC: 3.3 MIL/uL — ABNORMAL LOW (ref 3.87–5.11)
RDW: 12.2 % (ref 11.5–15.5)
WBC: 3 10*3/uL — ABNORMAL LOW (ref 4.0–10.5)

## 2017-07-26 LAB — I-STAT CG4 LACTIC ACID, ED
LACTIC ACID, VENOUS: 1.62 mmol/L (ref 0.5–1.9)
LACTIC ACID, VENOUS: 0.94 mmol/L (ref 0.5–1.9)

## 2017-07-26 LAB — MAGNESIUM: MAGNESIUM: 1.5 mg/dL — AB (ref 1.7–2.4)

## 2017-07-26 LAB — PROTIME-INR
INR: 1.01
Prothrombin Time: 13.3 seconds (ref 11.4–15.2)

## 2017-07-26 MED ORDER — POTASSIUM CHLORIDE 10 MEQ/100ML IV SOLN
10.0000 meq | INTRAVENOUS | Status: AC
  Administered 2017-07-26 (×2): 10 meq via INTRAVENOUS
  Filled 2017-07-26 (×2): qty 100

## 2017-07-26 MED ORDER — DEXTROSE 5 % IV SOLN: 2.0000 g | Freq: Three times a day (TID) | INTRAVENOUS | Status: DC

## 2017-07-26 MED ORDER — ONDANSETRON HCL 4 MG PO TABS: 4.0000 mg | ORAL_TABLET | Freq: Four times a day (QID) | ORAL | Status: DC | PRN

## 2017-07-26 MED ORDER — DEXTROSE 5 % IV SOLN
2.0000 g | Freq: Once | INTRAVENOUS | Status: AC
  Administered 2017-07-26: 2 g via INTRAVENOUS
  Filled 2017-07-26: qty 2

## 2017-07-26 MED ORDER — LIDOCAINE-PRILOCAINE 2.5-2.5 % EX CREA: 1.0000 "application " | TOPICAL_CREAM | CUTANEOUS | Status: DC | PRN

## 2017-07-26 MED ORDER — ENOXAPARIN SODIUM 40 MG/0.4ML ~~LOC~~ SOLN
40.0000 mg | SUBCUTANEOUS | Status: DC
  Administered 2017-07-26 – 2017-07-27 (×2): 40 mg via SUBCUTANEOUS
  Filled 2017-07-26 (×2): qty 0.4

## 2017-07-26 MED ORDER — SODIUM CHLORIDE 0.9 % IV BOLUS (SEPSIS)
1000.0000 mL | Freq: Once | INTRAVENOUS | Status: AC
  Administered 2017-07-26: 1000 mL via INTRAVENOUS

## 2017-07-26 MED ORDER — ACETAMINOPHEN 325 MG PO TABS
650.0000 mg | ORAL_TABLET | Freq: Four times a day (QID) | ORAL | Status: DC | PRN
  Administered 2017-07-26 – 2017-07-27 (×3): 650 mg via ORAL
  Filled 2017-07-26 (×3): qty 2

## 2017-07-26 MED ORDER — PIPERACILLIN-TAZOBACTAM 3.375 G IVPB 30 MIN: 3.3750 g | Freq: Four times a day (QID) | INTRAVENOUS | Status: DC

## 2017-07-26 MED ORDER — ACETAMINOPHEN 650 MG RE SUPP: 650.0000 mg | Freq: Four times a day (QID) | RECTAL | Status: DC | PRN

## 2017-07-26 MED ORDER — SODIUM CHLORIDE 0.9 % IV SOLN
Freq: Once | INTRAVENOUS | Status: AC
  Administered 2017-07-26: 13:00:00 via INTRAVENOUS

## 2017-07-26 MED ORDER — PIPERACILLIN-TAZOBACTAM 3.375 G IVPB 30 MIN
3.3750 g | Freq: Once | INTRAVENOUS | Status: AC
  Administered 2017-07-26: 3.375 g via INTRAVENOUS
  Filled 2017-07-26: qty 50

## 2017-07-26 MED ORDER — MAGIC MOUTHWASH
10.0000 mL | Freq: Once | ORAL | Status: AC
  Administered 2017-07-26: 10 mL via ORAL
  Filled 2017-07-26: qty 10

## 2017-07-26 MED ORDER — POTASSIUM CHLORIDE 10 MEQ/50ML IV SOLN: 10.0000 meq | INTRAVENOUS | Status: DC

## 2017-07-26 MED ORDER — DULOXETINE HCL 30 MG PO CPEP
30.0000 mg | ORAL_CAPSULE | Freq: Every day | ORAL | Status: DC
  Administered 2017-07-28: 30 mg via ORAL
  Filled 2017-07-26 (×2): qty 1

## 2017-07-26 MED ORDER — POTASSIUM CHLORIDE IN NACL 40-0.9 MEQ/L-% IV SOLN
INTRAVENOUS | Status: DC
  Administered 2017-07-26: 100 mL/h via INTRAVENOUS
  Filled 2017-07-26 (×2): qty 1000

## 2017-07-26 MED ORDER — PIPERACILLIN-TAZOBACTAM 3.375 G IVPB
3.3750 g | Freq: Three times a day (TID) | INTRAVENOUS | Status: DC
  Administered 2017-07-27 – 2017-07-28 (×4): 3.375 g via INTRAVENOUS
  Filled 2017-07-26 (×2): qty 50

## 2017-07-26 MED ORDER — OXYCODONE HCL 5 MG PO TABS
10.0000 mg | ORAL_TABLET | Freq: Once | ORAL | Status: AC
  Administered 2017-07-26: 10 mg via ORAL
  Filled 2017-07-26: qty 2

## 2017-07-26 MED ORDER — POTASSIUM CHLORIDE 10 MEQ/100ML IV SOLN
10.0000 meq | INTRAVENOUS | Status: AC
  Administered 2017-07-26 – 2017-07-27 (×4): 10 meq via INTRAVENOUS
  Filled 2017-07-26 (×5): qty 100

## 2017-07-26 MED ORDER — MAGIC MOUTHWASH W/LIDOCAINE
5.0000 mL | Freq: Four times a day (QID) | ORAL | Status: DC
  Administered 2017-07-26 – 2017-07-28 (×6): 5 mL via ORAL
  Filled 2017-07-26 (×9): qty 5

## 2017-07-26 MED ORDER — LOSARTAN POTASSIUM 50 MG PO TABS
50.0000 mg | ORAL_TABLET | Freq: Every day | ORAL | Status: DC
  Administered 2017-07-28: 50 mg via ORAL
  Filled 2017-07-26 (×2): qty 1

## 2017-07-26 MED ORDER — POTASSIUM CHLORIDE CRYS ER 20 MEQ PO TBCR
40.0000 meq | EXTENDED_RELEASE_TABLET | Freq: Once | ORAL | Status: AC
  Administered 2017-07-26: 40 meq via ORAL
  Filled 2017-07-26: qty 2

## 2017-07-26 MED ORDER — MAGNESIUM SULFATE 2 GM/50ML IV SOLN
2.0000 g | Freq: Once | INTRAVENOUS | Status: AC
  Administered 2017-07-26: 2 g via INTRAVENOUS
  Filled 2017-07-26: qty 50

## 2017-07-26 MED ORDER — OXYCODONE HCL 5 MG PO TABS
10.0000 mg | ORAL_TABLET | Freq: Four times a day (QID) | ORAL | Status: DC | PRN
  Administered 2017-07-27 – 2017-07-28 (×2): 10 mg via ORAL
  Filled 2017-07-26 (×2): qty 2

## 2017-07-26 MED ORDER — OXYCODONE HCL ER 20 MG PO T12A
20.0000 mg | EXTENDED_RELEASE_TABLET | Freq: Two times a day (BID) | ORAL | Status: DC
  Administered 2017-07-26 – 2017-07-28 (×4): 20 mg via ORAL
  Filled 2017-07-26 (×4): qty 1

## 2017-07-26 MED ORDER — MORPHINE SULFATE (PF) 2 MG/ML IV SOLN
4.0000 mg | Freq: Once | INTRAVENOUS | Status: AC
  Administered 2017-07-26: 4 mg via INTRAVENOUS
  Filled 2017-07-26: qty 2

## 2017-07-26 MED ORDER — SODIUM CHLORIDE 0.9% FLUSH
10.0000 mL | INTRAVENOUS | Status: DC | PRN
  Administered 2017-07-27: 20 mL
  Administered 2017-07-27 – 2017-07-28 (×2): 10 mL
  Filled 2017-07-26 (×3): qty 40

## 2017-07-26 MED ORDER — ONDANSETRON HCL 4 MG/2ML IJ SOLN: 4.0000 mg | Freq: Four times a day (QID) | INTRAMUSCULAR | Status: DC | PRN

## 2017-07-26 NOTE — ED Notes (Signed)
DELAY ON MEDICATIONS. IV TEAM AT BEDSIDE ATTEMPTING TO ACCESS  PASS PORT

## 2017-07-26 NOTE — Progress Notes (Signed)
Called for report 10 mins after patient pended to 3w , couldn't get the nurse.

## 2017-07-26 NOTE — ED Notes (Signed)
Patient transported to X-ray 

## 2017-07-26 NOTE — ED Triage Notes (Signed)
Pt states that she has CA and started a new chemo 8/14 and Sunday she started developing mouth sores and now she has fever. Alert and oriented.

## 2017-07-26 NOTE — ED Notes (Signed)
ED Provider at bedside. 

## 2017-07-26 NOTE — ED Notes (Signed)
Mystie RN MADE AWARE 2 OF 3 POTASSIUM INFUSED.ADMITTING MD STILL WAITING FOR [facility] TO RESPOND. RECEIVING RN TO INFORM PT AND FAMILY.

## 2017-07-26 NOTE — ED Notes (Signed)
BLOOD CULTURE 2ND SET COLLECTED BY MAIN LAB. TIME UNKNOWN

## 2017-07-26 NOTE — ED Notes (Signed)
REQUESTING DEE NT TO ASSIST WITH SECOND BLOOD CULTURE

## 2017-07-26 NOTE — ED Notes (Signed)
ED TO INPATIENT HANDOFF REPORT  Name/Age/Gender Lisa Fisher 56 y.o. female  Code Status    Code Status Orders        Start     Ordered   07/26/17 1654  Full code  Continuous     07/26/17 1657    Code Status History    Date Active Date Inactive Code Status Order ID Comments User Context   This patient has a current code status but no historical code status.      Home/SNF/Other Home  Chief Complaint Mouth Sores  Fever  Cancer Pt  Level of Care/Admitting Diagnosis ED Disposition    ED Disposition Condition Comment   Admit  Hospital Area: [place] [[id number]]  Level of Care: Telemetry [5]  Admit to tele based on following criteria: Other see comments  Comments: K 2.7  Diagnosis: Neutropenic fever [facility]) [[id number]]  Admitting Physician: Vashti Hey [[id number]]  Attending Physician: Vashti Hey [[id number]]  Estimated length of stay: past midnight tomorrow  Certification:: I certify this patient will need inpatient services for at least 2 midnights  PT Class (Do Not Modify): Inpatient [101]  PT Acc Code (Do Not Modify): Private [1]       Medical History Past Medical History:  Diagnosis Date  . Allergy   . Anxiety   . Asthma   . Breast cancer ([place]) 1999    left   . Breast cancer metastasized to bone ([place]) 03/2003   stage IV mets to sternum  . Depression   . GERD (gastroesophageal reflux disease)   . History of chemotherapy    hx: adjuvant Adriamycin and cyclphosphamide x4,, tamoxifen 2 y,,zometa ,trastuzumab  . History of radiation therapy    to sternum  . Hot flashes    due to letrozole  . 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 Osteopenia     Allergies Allergies  Allergen Reactions  . Levofloxacin Other (See Comments)    Tendonitis  . Naproxen Rash  . Tape Rash    Adhesive tape allergy,paper tape ok    IV Location/Drains/Wounds Patient Lines/Drains/Airways Status   Active  Line/Drains/Airways    Name:   Placement date:   Placement time:   Site:   Days:   Implanted Port 06/03/05 Right   06/03/05            4436   Peripheral IV 07/26/17 Right Forearm  07/26/17    1210    Forearm    less than 1          Labs/Imaging Results for orders placed or performed during the hospital encounter of 07/26/17 (from the past 48 hour(s))  Urinalysis, Routine w reflex microscopic     Status: Abnormal   Collection Time: 07/26/17 11:50 AM  Result Value Ref Range   Color, Urine AMBER (A) YELLOW    Comment: BIOCHEMICALS MAY BE AFFECTED BY COLOR   APPearance HAZY (A) CLEAR   Specific Gravity, Urine 1.027 1.005 - 1.030   pH 5.0 5.0 - 8.0   Glucose, UA NEGATIVE NEGATIVE mg/dL   Hgb urine dipstick NEGATIVE NEGATIVE   Bilirubin Urine NEGATIVE NEGATIVE   Ketones, ur 5 (A) NEGATIVE mg/dL   Protein, ur 30 (A) NEGATIVE mg/dL   Nitrite NEGATIVE NEGATIVE   Leukocytes, UA MODERATE (A) NEGATIVE   RBC / HPF 0-5 0 - 5 RBC/hpf   WBC, UA 6-30 0 - 5 WBC/hpf   Bacteria, UA RARE (A) NONE SEEN  Squamous Epithelial / LPF 0-5 (A) NONE SEEN   Mucus PRESENT    Hyaline Casts, UA PRESENT   Comprehensive metabolic panel     Status: Abnormal   Collection Time: 07/26/17 12:07 PM  Result Value Ref Range   Sodium 132 (L) 135 - 145 mmol/L   Potassium 2.7 (LL) 3.5 - 5.1 mmol/L    Comment: CRITICAL RESULT CALLED TO, READ BACK BY AND VERIFIED WITH: TALKINGTON,J @ 1319 ON [id number] BY POTEAT,S    Chloride 95 (L) 101 - 111 mmol/L   CO2 24 22 - 32 mmol/L   Glucose, Bld 106 (H) 65 - 99 mg/dL   BUN 13 6 - 20 mg/dL   Creatinine, Ser 0.60 0.44 - 1.00 mg/dL   Calcium 8.8 (L) 8.9 - 10.3 mg/dL   Total Protein 7.6 6.5 - 8.1 g/dL   Albumin 3.9 3.5 - 5.0 g/dL   AST 27 15 - 41 U/L   ALT 17 14 - 54 U/L   Alkaline Phosphatase 84 38 - 126 U/L   Total Bilirubin 0.5 0.3 - 1.2 mg/dL   GFR calc non Af Amer >60 >60 mL/min   GFR calc Af Amer >60 >60 mL/min    Comment: (NOTE) The eGFR has been calculated using the  CKD EPI equation. This calculation has not been validated in all clinical situations. eGFR's persistently <60 mL/min signify possible Chronic Kidney Disease.    Anion gap 13 5 - 15  CBC with Differential     Status: Abnormal   Collection Time: 07/26/17 12:07 PM  Result Value Ref Range   WBC 3.0 (L) 4.0 - 10.5 K/uL   RBC 3.30 (L) 3.87 - 5.11 MIL/uL   Hemoglobin 10.7 (L) 12.0 - 15.0 g/dL   HCT 31.0 (L) 36.0 - 46.0 %   MCV 93.9 78.0 - 100.0 fL   MCH 32.4 26.0 - 34.0 pg   MCHC 34.5 30.0 - 36.0 g/dL   RDW 12.2 11.5 - 15.5 %   Platelets 242 150 - 400 K/uL   Neutrophils Relative % 23 %   Lymphocytes Relative 19 %   Monocytes Relative 46 %   Eosinophils Relative 1 %   Basophils Relative 0 %   Metamyelocytes Relative 1 %   Myelocytes 10 %   nRBC 3 (H) 0 /100 WBC   Neutro Abs 1.0 (L) 1.7 - 7.7 K/uL   Lymphs Abs 0.6 (L) 0.7 - 4.0 K/uL   Monocytes Absolute 1.4 (H) 0.1 - 1.0 K/uL   Eosinophils Absolute 0.0 0.0 - 0.7 K/uL   Basophils Absolute 0.0 0.0 - 0.1 K/uL   RBC Morphology POLYCHROMASIA PRESENT     Comment: RARE NRBCs   WBC Morphology      MODERATE LEFT SHIFT (>5% METAS AND MYELOS,OCC PRO NOTED)    Comment: TOXIC GRANULATION DOHLE BODIES   Protime-INR     Status: None   Collection Time: 07/26/17 12:07 PM  Result Value Ref Range   Prothrombin Time 13.3 11.4 - 15.2 seconds   INR 1.01   Magnesium     Status: Abnormal   Collection Time: 07/26/17 12:07 PM  Result Value Ref Range   Magnesium 1.5 (L) 1.7 - 2.4 mg/dL  I-Stat CG4 Lactic Acid, ED     Status: None   Collection Time: 07/26/17 12:15 PM  Result Value Ref Range   Lactic Acid, Venous 1.62 0.5 - 1.9 mmol/L  I-Stat CG4 Lactic Acid, ED     Status: None   Collection Time: 07/26/17  2:22 PM  Result Value Ref Range   Lactic Acid, Venous 0.94 0.5 - 1.9 mmol/L   Dg Chest 2 View  Result Date: 07/26/2017 CLINICAL DATA:  Fever.  Chemotherapy. EXAM: CHEST  2 VIEW COMPARISON:  None. FINDINGS: There is a right-sided line  terminating in the central SVC. The heart, hila, and mediastinum are normal. No pneumothorax. No nodules or masses. No focal infiltrates. IMPRESSION: No cause for fever is identified. Electronically Signed   By: Dorise Bullion III M.D   On: 07/26/2017 13:27    Pending Labs Unresulted Labs    Start     Ordered   08/02/17 0500  Creatinine, serum  (enoxaparin (LOVENOX)    CrCl >/= 30 ml/min)  Weekly,   R    Comments:  while on enoxaparin therapy    07/26/17 1657   07/27/17 0500  Comprehensive metabolic panel  Tomorrow morning,   R     07/26/17 1657   07/26/17 2130  Basic metabolic panel  Once,   R     07/26/17 1657   07/26/17 1741  MRSA PCR Screening  Once,   R    Question:  Patient immune status  Answer:  Immunocompromised   07/26/17 1741   07/26/17 1654  CBC  (enoxaparin (LOVENOX)    CrCl >/= 30 ml/min)  Once,   R    Comments:  Baseline for enoxaparin therapy IF NOT ALREADY DRAWN.  Notify MD if PLT < 100 K.    07/26/17 1657   07/26/17 1654  Creatinine, serum  (enoxaparin (LOVENOX)    CrCl >/= 30 ml/min)  Once,   R    Comments:  Baseline for enoxaparin therapy IF NOT ALREADY DRAWN.    07/26/17 1657   07/26/17 1211  Urine culture  STAT,   STAT     07/26/17 1210   07/26/17 1207  Pathologist smear review  Once,   R     07/26/17 1207   07/26/17 1105  Culture, blood (Routine x 2)  BLOOD CULTURE X 2,   STAT     07/26/17 1104      Vitals/Pain Today's Vitals   07/26/17 1103 07/26/17 1227 07/26/17 1427 07/26/17 1718  BP:  128/63 (!) 152/90 (!) 152/83  Pulse:  90 94 97  Resp:  13 14 15   Temp:      TempSrc:      SpO2:  100% 100% 100%  Weight:      Height:      PainSc: 5        Isolation Precautions No active isolations  Medications Medications  potassium chloride 10 mEq in 100 mL IVPB (10 mEq Intravenous New Bag/Given 07/26/17 1719)  sodium chloride flush (NS) 0.9 % injection 10-40 mL (not administered)  DULoxetine (CYMBALTA) DR capsule 30 mg (not administered)   lidocaine-prilocaine (EMLA) cream 1 application (not administered)  losartan (COZAAR) tablet 50 mg (not administered)  Oxycodone HCl TABS 10 mg (not administered)  enoxaparin (LOVENOX) injection 40 mg (not administered)  0.9 % NaCl with KCl 40 mEq / L  infusion (not administered)  acetaminophen (TYLENOL) tablet 650 mg (not administered)    Or  acetaminophen (TYLENOL) suppository 650 mg (not administered)  ondansetron (ZOFRAN) tablet 4 mg (not administered)    Or  ondansetron (ZOFRAN) injection 4 mg (not administered)  magic mouthwash w/lidocaine (not administered)  potassium chloride 10 mEq in 50 mL *CENTRAL LINE* IVPB (not administered)  piperacillin-tazobactam (ZOSYN) IVPB 3.375 g (not administered)  sodium chloride 0.9 %  bolus 1,000 mL (1,000 mLs Intravenous New Bag/Given 07/26/17 1212)  0.9 %  sodium chloride infusion ( Intravenous Stopped 07/26/17 1254)  ceFEPIme (MAXIPIME) 2 g in dextrose 5 % 50 mL IVPB (0 g Intravenous Stopped 07/26/17 1337)  magic mouthwash (10 mLs Oral Given 07/26/17 1300)  potassium chloride SA (K-DUR,KLOR-CON) CR tablet 40 mEq (40 mEq Oral Given 07/26/17 1333)  magnesium sulfate IVPB 2 g 50 mL (0 g Intravenous Stopped 07/26/17 1641)  oxyCODONE (Oxy IR/ROXICODONE) immediate release tablet 10 mg (10 mg Oral Given 07/26/17 1545)    Mobility walks with person assist

## 2017-07-26 NOTE — ED Provider Notes (Signed)
[facility] DEPT Provider Note   CSN: [id number] Arrival date & time: 07/26/17  1054     History   Chief Complaint Chief Complaint  Patient presents with  . Fever  . Mouth Lesions    HPI Lisa Fisher is a 56 y.o. female.  HPI   56 yo F with h/o breast CA here with fever, mouth sores. Pt reportedly just began chemotherapy for her breast CA on Tuesday. Starting over the weekend, she has had progressively worsening mouth sores, pain with swallowing. She has had difficulty eating/drinking 2/2 these sores. She has had associated generalized weakness and earlier today, spiked a fever to 101F. She called her oncologist who advised ED evaluation. No known h/o neutropenia. This was her first chemo session with new agents. No abdominal pain, nausea, vomiting. No dysuria or frequency. No cough or SOB. She ahs not taken any antipyretics today.  Past Medical History:  Diagnosis Date  . Allergy   . Anxiety   . Asthma   . Breast cancer ([place]) 1999    left   . 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 Depression   . GERD (gastroesophageal reflux disease)   . History of chemotherapy    hx: adjuvant Adriamycin and cyclphosphamide x4,, tamoxifen 2 y,,zometa ,trastuzumab  . History of radiation therapy    to sternum  . Hot flashes    due to letrozole  . 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 Osteopenia     Patient Active Problem List   Diagnosis Date Noted  . Mucositis due to chemotherapy 07/26/2017  . Benign essential HTN 07/26/2017  . GERD (gastroesophageal reflux disease) 07/26/2017  . Depression 07/26/2017  . Neutropenic fever ([place]) 07/26/2017  . Hypokalemia due to inadequate potassium intake 07/26/2017  . Breast cancer ([place])   . Metastatic adenocarcinoma to bone ([place])   . Breast cancer metastasized to bone ([place]) 03/05/2003    Past Surgical History:  Procedure Laterality Date  . BREAST LUMPECTOMY Left 04/23/03   metastatic to sternum, with sentinel lymph node, ER/PR=+,Her2=+-invasive  . CHOLECYSTECTOMY  1998    OB History    No data available       Home Medications    Prior to Admission medications   Medication Sig Start Date End Date Taking? Authorizing Provider  albuterol (PROVENTIL HFA;VENTOLIN HFA) 108 (90 BASE) MCG/ACT inhaler Inhale 2 puffs into the lungs every 4 (four) hours as needed for wheezing.   Yes [provider]  ascorbic acid (VITAMIN C) 500 MG tablet Take 500 mg by mouth daily.   Yes [provider]  calcium-vitamin D (OSCAL WITH D) 500-200 MG-UNIT tablet Take 1 tablet by mouth daily with breakfast.   Yes [provider]  docetaxel (TAXOTERE) 20 MG/0.5ML injection Inject into the vein.   Yes [provider]  docusate sodium (COLACE) 100 MG capsule Take 100 mg by mouth 2 (two) times daily.   Yes [provider]  DULoxetine (CYMBALTA) 30 MG capsule Take 30 mg by mouth daily.   Yes [provider]  lidocaine-prilocaine (EMLA) cream Apply 1 application topically as needed.   Yes [provider]  losartan (COZAAR) 50 MG tablet Take 50 mg by mouth daily.   Yes [provider]  magnesium oxide (MAG-OX) 400 MG tablet Take 400 mg by mouth daily.   Yes [provider]  omeprazole (PRILOSEC) 20 MG capsule Take 20 mg by mouth daily.  Yes [provider]  Oxycodone HCl 10 MG TABS Take 10 mg by mouth every 6 (six) hours as needed.   Yes [provider]  Oxycodone HCl 20 MG TABS Take 20 mg by mouth 2 (two) times daily.   Yes [provider]  PERTUZUMAB IV Inject into the vein.   Yes [provider]  senna (SENOKOT) 8.6 MG tablet Take 1 tablet by mouth daily.   Yes [provider]  trastuzumab (HERCEPTIN) 150 MG SOLR injection Inject into the vein.   Yes [provider]    Family History Family History  Problem Relation Age of Onset  . Cancer Mother 36        breast    Social History Social History  Substance Use Topics  . Smoking status: Never Smoker  . Smokeless tobacco: Never Used  . Alcohol use 6.0 oz/week    10 Glasses of wine per week     Comment: weekly     Allergies   Levofloxacin; Naproxen; and Tape   Review of Systems Review of Systems  Constitutional: Positive for chills, fatigue and fever.  HENT: Positive for mouth sores. Negative for congestion, rhinorrhea and sore throat.   Eyes: Negative for visual disturbance.  Respiratory: Negative for cough, shortness of breath and wheezing.   Cardiovascular: Negative for chest pain and leg swelling.  Gastrointestinal: Negative for abdominal pain, diarrhea, nausea and vomiting.  Genitourinary: Negative for dysuria, flank pain, vaginal bleeding and vaginal discharge.  Musculoskeletal: Negative for neck pain.  Skin: Negative for rash.  Allergic/Immunologic: Positive for immunocompromised state.  Neurological: Positive for weakness. Negative for syncope and headaches.  Hematological: Does not bruise/bleed easily.  All other systems reviewed and are negative.    Physical Exam Updated Vital Signs BP (!) 182/80 (BP Location: Right Leg)   Pulse 90   Temp 100 F (37.8 C) (Oral)   Resp 18   Ht 5' 7"  (1.702 m)   Wt 61.2 kg (135 lb)   SpO2 100%   BMI 21.14 kg/m   Physical Exam  Constitutional: She is oriented to person, place, and time. She appears well-developed and well-nourished. No distress.  HENT:  Head: Normocephalic and atraumatic.  Multiple scattered ulcerations noted throughout tongue, gingiva, and posterior oropharynx. No tonsillar asymmetry. OP widely patent, no pooling of secretions.  Eyes: Conjunctivae are normal.  Neck: Neck supple.  Cardiovascular: Normal rate, regular rhythm and normal heart sounds.  Exam reveals no friction rub.   No murmur heard. Pulmonary/Chest: Effort normal and breath sounds normal. No respiratory distress. She has no wheezes. She  has no rales.  Abdominal: She exhibits no distension.  Musculoskeletal: She exhibits no edema.  Neurological: She is alert and oriented to person, place, and time. She exhibits normal muscle tone.  Skin: Skin is warm. Capillary refill takes less than 2 seconds. Rash (scattered erythematous papules on anterior chest) noted.  Psychiatric: She has a normal mood and affect.  Nursing note and vitals reviewed.    ED Treatments / Results  Labs (all labs ordered are listed, but only abnormal results are displayed) Labs Reviewed  COMPREHENSIVE METABOLIC PANEL - Abnormal; Notable for the following:       Result Value   Sodium 132 (*)    Potassium 2.7 (*)    Chloride 95 (*)    Glucose, Bld 106 (*)    Calcium 8.8 (*)    All other components within normal limits  CBC WITH DIFFERENTIAL/PLATELET - Abnormal; Notable for the  following:    WBC 3.0 (*)    RBC 3.30 (*)    Hemoglobin 10.7 (*)    HCT 31.0 (*)    nRBC 3 (*)    Neutro Abs 1.0 (*)    Lymphs Abs 0.6 (*)    Monocytes Absolute 1.4 (*)    All other components within normal limits  URINALYSIS, ROUTINE W REFLEX MICROSCOPIC - Abnormal; Notable for the following:    Color, Urine AMBER (*)    APPearance HAZY (*)    Ketones, ur 5 (*)    Protein, ur 30 (*)    Leukocytes, UA MODERATE (*)    Bacteria, UA RARE (*)    Squamous Epithelial / LPF 0-5 (*)    All other components within normal limits  MAGNESIUM - Abnormal; Notable for the following:    Magnesium 1.5 (*)    All other components within normal limits  CULTURE, BLOOD (ROUTINE X 2)  CULTURE, BLOOD (ROUTINE X 2)  URINE CULTURE  MRSA PCR SCREENING  PROTIME-INR  PATHOLOGIST SMEAR REVIEW  CBC  CREATININE, SERUM  BASIC METABOLIC PANEL  COMPREHENSIVE METABOLIC PANEL  I-STAT CG4 LACTIC ACID, ED  I-STAT CG4 LACTIC ACID, ED    EKG  EKG Interpretation None       Radiology Dg Chest 2 View  Result Date: 07/26/2017 CLINICAL DATA:  Fever.  Chemotherapy. EXAM: CHEST  2 VIEW  COMPARISON:  None. FINDINGS: There is a right-sided line terminating in the central SVC. The heart, hila, and mediastinum are normal. No pneumothorax. No nodules or masses. No focal infiltrates. IMPRESSION: No cause for fever is identified. Electronically Signed   By: Dorise Bullion III M.D   On: 07/26/2017 13:27    Procedures Procedures (including critical care time)  Medications Ordered in ED Medications  potassium chloride 10 mEq in 100 mL IVPB (0 mEq Intravenous Stopped 07/26/17 1848)  sodium chloride flush (NS) 0.9 % injection 10-40 mL (not administered)  DULoxetine (CYMBALTA) DR capsule 30 mg (not administered)  lidocaine-prilocaine (EMLA) cream 1 application (not administered)  losartan (COZAAR) tablet 50 mg (not administered)  oxyCODONE (Oxy IR/ROXICODONE) immediate release tablet 10 mg (not administered)  enoxaparin (LOVENOX) injection 40 mg (not administered)  0.9 % NaCl with KCl 40 mEq / L  infusion (not administered)  acetaminophen (TYLENOL) tablet 650 mg (not administered)    Or  acetaminophen (TYLENOL) suppository 650 mg (not administered)  ondansetron (ZOFRAN) tablet 4 mg (not administered)    Or  ondansetron (ZOFRAN) injection 4 mg (not administered)  magic mouthwash w/lidocaine (not administered)  piperacillin-tazobactam (ZOSYN) IVPB 3.375 g (3.375 g Intravenous Canceled Entry 07/26/17 1906)  piperacillin-tazobactam (ZOSYN) IVPB 3.375 g (3.375 g Intravenous New Bag/Given 07/26/17 1910)  potassium chloride 10 mEq in 100 mL IVPB (not administered)  morphine 2 MG/ML injection 4 mg (not administered)  sodium chloride 0.9 % bolus 1,000 mL (1,000 mLs Intravenous New Bag/Given 07/26/17 1212)  0.9 %  sodium chloride infusion ( Intravenous Stopped 07/26/17 1254)  ceFEPIme (MAXIPIME) 2 g in dextrose 5 % 50 mL IVPB (0 g Intravenous Stopped 07/26/17 1337)  magic mouthwash (10 mLs Oral Given 07/26/17 1300)  potassium chloride SA (K-DUR,KLOR-CON) CR tablet 40 mEq (40 mEq Oral Given  07/26/17 1333)  magnesium sulfate IVPB 2 g 50 mL (0 g Intravenous Stopped 07/26/17 1641)  oxyCODONE (Oxy IR/ROXICODONE) immediate release tablet 10 mg (10 mg Oral Given 07/26/17 1545)     Initial Impression / Assessment and Plan / ED Course  I have reviewed  the triage vital signs and the nursing notes.  Pertinent labs & imaging results that were available during my care of the patient were reviewed by me and considered in my medical decision making (see chart for details).     56 yo F with h/o metastatic breast CA here with fever and mouth sores s/p initiating chemo. Suspect this is all 2/2 acute, severe stomatitis from recent chemo. She is not neutropenic. Nonetheless, she does have high fever so will send BCx, give fluids and Cefepime. No known h/o MRSA. Her skin papules are known mets from her bony disease and I do not suspect staph abscess. Will admit for fluids, monitoring. K also low likely 2/2 poor Po intake - replaced here. CXR is clear. UA with possible UTI though pt has no sx -ABX given..  Final Clinical Impressions(s) / ED Diagnoses   Final diagnoses:  Fever in adult  Immunosuppressed due to chemotherapy  Hypokalemia    New Prescriptions Current Discharge Medication List       Duffy Bruce, MD 07/26/17 [phone]

## 2017-07-26 NOTE — H&P (Addendum)
History and Physical:    Lisa Fisher   ZOX:[id number] DOB: 1961-02-18 DOA: 07/26/2017  Referring MD/provider: Dr. Ellender Hose PCP: Carol Ada, MD   Patient coming from: Home  Chief Complaint: "I've got sores all in my mouth"  History of Present Illness:   Lisa Fisher is an 56 y.o. female with a past medical history significant for breast cancer who is undergoing palliative treatment at [facility] on THP therapy (Taxotere, Herceptin and Pertuzumab). She had her first dose 5 days ago and notes she felt extremely tired and malaise as was expected following 3 days. 2 days ago patient woke with a fever to 100.8 which defervesced within the hour. She subsequently noted onset of sores around her lips which she self treated with hydrogen peroxide. As the sores progressed she contacted her oncologist at [facility] Dr. Harden Mo who suggested she come in to [facility] for evaluation however she was unable to do so. She went to see her PCP who started her on acyclovir, Magic mouthwash and lidocaine cream with minimal improvement in pain. This morning patient woke to a fever 101.8 and markedly worsening of the sores in her mouth. She contacted Dr. Rayfield Citizen office again who advised her to come to the ED.  Patient admits to feeling clammy and chilly. She denies headache or photophobia, no cough or shortness of breath, no abdominal pain. She does admit to loose stools one a day for the past week but has not had any L movements for the past 2 days she has not had any by mouth's. Patient denies dysuria or back pain or nausea or vomiting.  Patient denies any sores or pain or vaginal or anal area. She admits to pain on the pinna of her ear but denies any pain inside her ear. No problems with vision or with hearing.  ED Course:  The patient was treated with IV hydration and lidocaine swish and spit with some improvement in pain management. She was given cefepime for broad spectrum antibiotics for neutropenic  fever.  ROS:   ROS 10 point review of systems otherwise negative unless as mentioned in history of present illness.  Past Medical History:   Past Medical History:  Diagnosis Date  . Allergy   . Anxiety   . Asthma   . Breast cancer ([place]) 1999    left   . 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 Depression   . GERD (gastroesophageal reflux disease)   . History of chemotherapy    hx: adjuvant Adriamycin and cyclphosphamide x4,, tamoxifen 2 y,,zometa ,trastuzumab  . History of radiation therapy    to sternum  . Hot flashes    due to letrozole  . Metastatic adenocarcinoma to bone ([place])    sternum  . Osteopenia     Past Surgical History:   Past Surgical History:  Procedure Laterality Date  . BREAST LUMPECTOMY Left 04/23/03   metastatic to sternum, with sentinel lymph node, ER/PR=+,Her2=+-invasive  . CHOLECYSTECTOMY  1998    Social History:   Social History   Social History  . Marital status: Married    Spouse name: N/A  . Number of children: N/A  . Years of education: N/A   Occupational History  . Not on file.   Social History Main Topics  . Smoking status: Never Smoker  . Smokeless tobacco: Never Used  . Alcohol use 6.0 oz/week    10 Glasses of wine per week  Comment: weekly  . Drug use: No  . Sexual activity: Yes    Birth control/ protection: Post-menopausal   Other Topics Concern  . Not on file   Social History Narrative  . No narrative on file    Allergies   Levofloxacin; Naproxen; and Tape  Family history:   Family History  Problem Relation Age of Onset  . Cancer Mother 10       breast    Current Medications:   Prior to Admission medications   Medication Sig Start Date End Date Taking? Authorizing Provider  albuterol (PROVENTIL HFA;VENTOLIN HFA) 108 (90 BASE) MCG/ACT inhaler Inhale 2 puffs into the lungs every 4 (four) hours as needed for wheezing.   Yes [provider]  ascorbic acid  (VITAMIN C) 500 MG tablet Take 500 mg by mouth daily.   Yes [provider]  calcium-vitamin D (OSCAL WITH D) 500-200 MG-UNIT tablet Take 1 tablet by mouth daily with breakfast.   Yes [provider]  docetaxel (TAXOTERE) 20 MG/0.5ML injection Inject into the vein.   Yes [provider]  docusate sodium (COLACE) 100 MG capsule Take 100 mg by mouth 2 (two) times daily.   Yes [provider]  DULoxetine (CYMBALTA) 30 MG capsule Take 30 mg by mouth daily.   Yes [provider]  lidocaine-prilocaine (EMLA) cream Apply 1 application topically as needed.   Yes [provider]  losartan (COZAAR) 50 MG tablet Take 50 mg by mouth daily.   Yes [provider]  magnesium oxide (MAG-OX) 400 MG tablet Take 400 mg by mouth daily.   Yes [provider]  omeprazole (PRILOSEC) 20 MG capsule Take 20 mg by mouth daily.   Yes [provider]  Oxycodone HCl 10 MG TABS Take 10 mg by mouth every 6 (six) hours as needed.   Yes [provider]  Oxycodone HCl 20 MG TABS Take 20 mg by mouth 2 (two) times daily.   Yes [provider]  PERTUZUMAB IV Inject into the vein.   Yes [provider]  senna (SENOKOT) 8.6 MG tablet Take 1 tablet by mouth daily.   Yes [provider]  trastuzumab (HERCEPTIN) 150 MG SOLR injection Inject into the vein.   Yes [provider]    Physical Exam:   Vitals:   07/26/17 1059 07/26/17 1227 07/26/17 1427  BP: 124/83 128/63 (!) 152/90  Pulse: 100 90 94  Resp: _0 Temp: 99.8 F (37.7 C)    TempSrc: Oral    SpO2: 96% 100% 100%  Weight: 61.2 kg (135 lb)    Height: _1  (1.702 m)       Physical Exam: Blood pressure (!) 152/90, pulse 94, temperature 99.8 F (37.7 C), temperature source Oral, resp. rate 14, height _2  (1.702 m), weight 61.2 kg (135 lb), SpO2 100 %. Gen: Patient with short hair sitting up in bed appearing to be in significant pain holding  her lips open. Eyes: Sclerae anicteric. Conjunctiva are not injected and have no ulcerations. Neck: Supple, no jugular venous distention. Chest: Moderately good air entry bilaterally with no adventitious sounds.  CV: Distant, regular, no audible murmurs. Abdomen: NABS, soft, nondistended, nontender. No tenderness to light or deep palpation. No rebound, no guarding. Extremities: No edema.  Skin: Patient has several vesicular ulcers on her lips, she has a large vesicle that takes up the space under her tongue. She has some small red lesions in her posterior oropharynx. Posterior oropharynx  is erythematous. Patient also has scattered scaly tender papules on her ears her external auditory meatus on her face, forehead and scalp. No involvement in the rest of her body. Neuro: Alert and oriented times 3; grossly nonfocal. Psych: Patient is cooperative, logical and coherent.  Data Review:    Labs: Basic Metabolic Panel:  Recent Labs Lab 07/26/17 1207  NA 132*  K 2.7*  CL 95*  CO2 24  GLUCOSE 106*  BUN 13  CREATININE 0.60  CALCIUM 8.8*  MG 1.5*   Liver Function Tests:  Recent Labs Lab 07/26/17 1207  AST 27  ALT 17  ALKPHOS 84  BILITOT 0.5  PROT 7.6  ALBUMIN 3.9   No results for input(s): LIPASE, AMYLASE in the last 168 hours. No results for input(s): AMMONIA in the last 168 hours. CBC:  Recent Labs Lab 07/26/17 1207  WBC 3.0*  NEUTROABS 1.0*  HGB 10.7*  HCT 31.0*  MCV 93.9  PLT 242   Cardiac Enzymes: No results for input(s): CKTOTAL, CKMB, CKMBINDEX, TROPONINI in the last 168 hours.  BNP (last 3 results) No results for input(s): PROBNP in the last 8760 hours. CBG: No results for input(s): GLUCAP in the last 168 hours.  Urinalysis    Component Value Date/Time   COLORURINE AMBER (A) 07/26/2017 1150   APPEARANCEUR HAZY (A) 07/26/2017 1150   LABSPEC 1.027 07/26/2017 1150   PHURINE 5.0 07/26/2017 1150   GLUCOSEU NEGATIVE 07/26/2017 1150   HGBUR NEGATIVE  07/26/2017 1150   BILIRUBINUR NEGATIVE 07/26/2017 1150   KETONESUR 5 (A) 07/26/2017 1150   PROTEINUR 30 (A) 07/26/2017 1150   NITRITE NEGATIVE 07/26/2017 1150   LEUKOCYTESUR MODERATE (A) 07/26/2017 1150      Radiographic Studies: Dg Chest 2 View  Result Date: 07/26/2017 CLINICAL DATA:  Fever.  Chemotherapy. EXAM: CHEST  2 VIEW COMPARISON:  None. FINDINGS: There is a right-sided line terminating in the central SVC. The heart, hila, and mediastinum are normal. No pneumothorax. No nodules or masses. No focal infiltrates. IMPRESSION: No cause for fever is identified. Electronically Signed   By: Dorise Bullion III M.D   On: 07/26/2017 13:27     Assessment/Plan:   Active Problems:   Mucositis due to chemotherapy   Benign essential HTN   GERD (gastroesophageal reflux disease)   Depression    MUCOSITIS Patient with THP induced mucositis.  We'll treat symptomatically with lidocaine swish and spit.  I have phone call into [facility]. Harden Mo at [facility] for further management suggestions.  NEUTROPENIC FEVER Patient was given cefepime for broad spectrum coverage of neutropenic fever In ED ANC is 1000 per lab report however is 663 per my calculation. It is unclear how long her neutropenia is to last. No clear source of fever other than her mucositis. Will start Zosyn for neutropenic fever. Check MRSA screen and if positive would add vancomycin.  HYPOKALEMIA Will supplement aggressively and recheck. EKG is ordered and pending.  BREAST CA  On palliative THP per Dr. Harden Mo. This will not be addressed actively in the house during this hospital stay.  HTN Continue losartan if she is able to swallow the pill. If not can treat severe hypertension with when necessary hydralazine.  GERD We'll change her PPI to IV pantoprazole.  DEPRESSION We'll try to continue her duloxetine. However if she is unable to take it orally, will provide lorazepam IV when necessary for withdrawal  symptoms.    Other information:   DVT prophylaxis: Lovenox ordered. Code Status: Full code. Family Communication: Patient's  husband was at bedside throughout the medical encounter  Disposition Plan: Home  Consults called:  Dr. Harden Mo from [facility] patient's oncologist Admission status: Inpatient   The medical decision making on this patient was of high complexity and the patient is at high risk for clinical deterioration, therefore this is a level 3 visit.  Dewaine Oats Derek Jack [facility] Pager [phone] Cell: [phone]   If 7PM-7AM, please contact night-coverage [email or web address] Password [facility] 07/26/2017, 4:23 PM

## 2017-07-26 NOTE — ED Notes (Signed)
MAIN LAB AT BEDSIDE ATTEMPTING 2ND SET OF BLOOD CULTURE

## 2017-07-26 NOTE — ED Notes (Signed)
Report [id number] with April phone [phone]

## 2017-07-27 DIAGNOSIS — K219 Gastro-esophageal reflux disease without esophagitis: Secondary | ICD-10-CM

## 2017-07-27 DIAGNOSIS — Z79899 Other long term (current) drug therapy: Secondary | ICD-10-CM

## 2017-07-27 LAB — CBC WITH DIFFERENTIAL/PLATELET
BAND NEUTROPHILS: 31 %
Basophils Absolute: 0 10*3/uL (ref 0.0–0.1)
Basophils Relative: 0 %
EOS ABS: 0 10*3/uL (ref 0.0–0.7)
Eosinophils Relative: 0 %
HEMATOCRIT: 26.1 % — AB (ref 36.0–46.0)
HEMOGLOBIN: 9.2 g/dL — AB (ref 12.0–15.0)
LYMPHS ABS: 1.2 10*3/uL (ref 0.7–4.0)
LYMPHS PCT: 17 %
MCH: 33.1 pg (ref 26.0–34.0)
MCHC: 35.2 g/dL (ref 30.0–36.0)
MCV: 93.9 fL (ref 78.0–100.0)
METAMYELOCYTES PCT: 15 %
Monocytes Absolute: 1.5 10*3/uL — ABNORMAL HIGH (ref 0.1–1.0)
Monocytes Relative: 22 %
Myelocytes: 9 %
NEUTROS ABS: 4.3 10*3/uL (ref 1.7–7.7)
Neutrophils Relative %: 6 %
Platelets: 218 10*3/uL (ref 150–400)
RBC: 2.78 MIL/uL — AB (ref 3.87–5.11)
RDW: 12.4 % (ref 11.5–15.5)
WBC: 7 10*3/uL (ref 4.0–10.5)

## 2017-07-27 LAB — COMPREHENSIVE METABOLIC PANEL
ALT: 13 U/L — ABNORMAL LOW (ref 14–54)
AST: 20 U/L (ref 15–41)
Albumin: 2.6 g/dL — ABNORMAL LOW (ref 3.5–5.0)
Alkaline Phosphatase: 64 U/L (ref 38–126)
Anion gap: 6 (ref 5–15)
BILIRUBIN TOTAL: 0.4 mg/dL (ref 0.3–1.2)
BUN: 7 mg/dL (ref 6–20)
CO2: 23 mmol/L (ref 22–32)
Calcium: 7.5 mg/dL — ABNORMAL LOW (ref 8.9–10.3)
Chloride: 104 mmol/L (ref 101–111)
Creatinine, Ser: 0.39 mg/dL — ABNORMAL LOW (ref 0.44–1.00)
Glucose, Bld: 90 mg/dL (ref 65–99)
POTASSIUM: 4.1 mmol/L (ref 3.5–5.1)
Sodium: 133 mmol/L — ABNORMAL LOW (ref 135–145)
TOTAL PROTEIN: 5.7 g/dL — AB (ref 6.5–8.1)

## 2017-07-27 LAB — URINE CULTURE

## 2017-07-27 LAB — MRSA PCR SCREENING: MRSA BY PCR: POSITIVE — AB

## 2017-07-27 LAB — PATHOLOGIST SMEAR REVIEW

## 2017-07-27 MED ORDER — VALACYCLOVIR HCL 500 MG PO TABS
1000.0000 mg | ORAL_TABLET | Freq: Three times a day (TID) | ORAL | Status: DC
  Administered 2017-07-27 – 2017-07-28 (×4): 1000 mg via ORAL
  Filled 2017-07-27 (×5): qty 2

## 2017-07-27 MED ORDER — MUPIROCIN 2 % EX OINT
1.0000 "application " | TOPICAL_OINTMENT | Freq: Two times a day (BID) | CUTANEOUS | Status: DC
  Administered 2017-07-27 – 2017-07-28 (×3): 1 via NASAL
  Filled 2017-07-27 (×2): qty 22

## 2017-07-27 MED ORDER — SODIUM CHLORIDE 0.9 % IV SOLN
Freq: Once | INTRAVENOUS | Status: AC
  Administered 2017-07-27: 10:00:00 via INTRAVENOUS

## 2017-07-27 MED ORDER — CHLORHEXIDINE GLUCONATE CLOTH 2 % EX PADS
6.0000 | MEDICATED_PAD | Freq: Every day | CUTANEOUS | Status: DC
  Administered 2017-07-28: 6 via TOPICAL

## 2017-07-27 NOTE — Progress Notes (Signed)
PROGRESS NOTE    Lisa Fisher  YCX:[id number] DOB: January 09, 1961 DOA: 07/26/2017 PCP: Carol Ada, MD   Brief Narrative: Lisa Fisher is a 56 y.o. female with a history of breast cancer undergoing palliative treatment. She presented with fever and was found to be neutropenic. She is given empiric antibiotics and infectious workup pain. She has had mild fevers overnight. Symptomatically, she is feeling better.   Assessment & Plan:   Active Problems:   Mucositis due to chemotherapy   Benign essential HTN   GERD (gastroesophageal reflux disease)   Depression   Neutropenic fever (HCC)   Hypokalemia due to inadequate potassium intake   Mucositis Present on admission. Significantly improved. Possible HSV infection. -continue outpatient Valtrex treatment  Neutropenic fever ANC of 690 when calculated. Febrile overnight. Possibly secondary to viral illness. Source is likely mucositis. -blood/urine culture pending -repeat CBC w/ diff -continue Zosyn -continue   Hypokalemia Resolved with potassium containing IV fluids  Breast cancer Follows with Dr. Harden Mo at [facility]. She is available for discussion if needed. Outpatient follow-up for this problem  Essential hypertension -continue losartan  GERD -continue PPI  Depression -continue duloxetine   DVT prophylaxis: Lovenox Code Status: Full code Family Communication: Husband at bedside Disposition Plan: Discharge in 24-48 hours   Consultants:   None  Procedures:   None  Antimicrobials:  Zosyn    Subjective: Patient reports significant improvement in oral pain. Febrile overnight.  Objective: Vitals:   07/26/17 1900 07/26/17 2027 07/26/17 2208 07/27/17 0453  BP: (!) 182/80 114/60  128/66  Pulse: 90 (!) 107  97  Resp: 18 18  19   Temp: 100 F (37.8 C) (!) 101 F (38.3 C) (!) 100.5 F (38.1 C) (!) 100.6 F (38.1 C)  TempSrc: Oral Oral Oral Oral  SpO2: 100% 100%  97%  Weight: 61.2 kg (135 lb)       Height: 5\' 7"  (1.702 m)       Intake/Output Summary (Last 24 hours) at 07/27/17 1203 Last data filed at 07/27/17 1000  Gross per 24 hour  Intake          2678.33 ml  Output                0 ml  Net          2678.33 ml   Filed Weights   07/26/17 1059 07/26/17 1900  Weight: 61.2 kg (135 lb) 61.2 kg (135 lb)    Examination:  General exam: Appears calm and comfortable ENT: erythematous crusted lesion on lip with ulcer at posterior pharynx on right side Respiratory system: Clear to auscultation. Respiratory effort normal. Cardiovascular system: S1 & S2 heard, RRR. No murmurs. Gastrointestinal system: Abdomen is nondistended, soft and nontender. No organomegaly or masses felt. Normal bowel sounds heard. Central nervous system: Alert and oriented. No focal neurological deficits. Extremities: No edema. No calf tenderness Skin: No cyanosis. No rashes Psychiatry: Judgement and insight appear normal. Mood & affect appropriate.     Data Reviewed: I have personally reviewed following labs and imaging studies  CBC:  Recent Labs Lab 07/26/17 1207  WBC 3.0*  NEUTROABS 1.0*  HGB 10.7*  HCT 31.0*  MCV 93.9  PLT 497   Basic Metabolic Panel:  Recent Labs Lab 07/26/17 1207 07/26/17 2052 07/27/17 0452  NA 132* 132* 133*  K 2.7* 3.2* 4.1  CL 95* 102 104  CO2 24 23 23   GLUCOSE 106* 146* 90  BUN 13 10 7   CREATININE 0.60 0.40* 0.39*  CALCIUM  8.8* 7.9* 7.5*  MG 1.5*  --   --    GFR: Estimated Creatinine Clearance: 75.9 mL/min (A) (by C-G formula based on SCr of 0.39 mg/dL (L)). Liver Function Tests:  Recent Labs Lab 07/26/17 1207 07/27/17 0452  AST 27 20  ALT 17 13*  ALKPHOS 84 64  BILITOT 0.5 0.4  PROT 7.6 5.7*  ALBUMIN 3.9 2.6*   No results for input(s): LIPASE, AMYLASE in the last 168 hours. No results for input(s): AMMONIA in the last 168 hours. Coagulation Profile:  Recent Labs Lab 07/26/17 1207  INR 1.01   Cardiac Enzymes: No results for input(s):  CKTOTAL, CKMB, CKMBINDEX, TROPONINI in the last 168 hours. BNP (last 3 results) No results for input(s): PROBNP in the last 8760 hours. HbA1C: No results for input(s): HGBA1C in the last 72 hours. CBG: No results for input(s): GLUCAP in the last 168 hours. Lipid Profile: No results for input(s): CHOL, HDL, LDLCALC, TRIG, CHOLHDL, LDLDIRECT in the last 72 hours. Thyroid Function Tests: No results for input(s): TSH, T4TOTAL, FREET4, T3FREE, THYROIDAB in the last 72 hours. Anemia Panel: No results for input(s): VITAMINB12, FOLATE, FERRITIN, TIBC, IRON, RETICCTPCT in the last 72 hours. Sepsis Labs:  Recent Labs Lab 07/26/17 1215 07/26/17 1422  LATICACIDVEN 1.62 0.94    Recent Results (from the past 240 hour(s))  Urine culture     Status: Abnormal   Collection Time: 07/26/17 11:50 AM  Result Value Ref Range Status   Specimen Description URINE, CLEAN CATCH  Final   Special Requests NONE  Final   Culture MULTIPLE SPECIES PRESENT, SUGGEST RECOLLECTION (A)  Final   Report Status 07/27/2017 FINAL  Final  MRSA PCR Screening     Status: Abnormal   Collection Time: 07/27/17  6:30 AM  Result Value Ref Range Status   MRSA by PCR POSITIVE (A) NEGATIVE Final    Comment:        The GeneXpert MRSA Assay (FDA approved for NASAL specimens only), is one component of a comprehensive MRSA colonization surveillance program. It is not intended to diagnose MRSA infection nor to guide or monitor treatment for MRSA infections. RESULT CALLED TO, READ BACK BY AND VERIFIED WITH: JOHNSON,R @ [place] [id number] BY POTEAT,S          Radiology Studies: Dg Chest 2 View  Result Date: 07/26/2017 CLINICAL DATA:  Fever.  Chemotherapy. EXAM: CHEST  2 VIEW COMPARISON:  None. FINDINGS: There is a right-sided line terminating in the central SVC. The heart, hila, and mediastinum are normal. No pneumothorax. No nodules or masses. No focal infiltrates. IMPRESSION: No cause for fever is identified. Electronically  Signed   By: Dorise Bullion III M.D   On: 07/26/2017 13:27        Scheduled Meds: . DULoxetine  30 mg Oral Daily  . enoxaparin (LOVENOX) injection  40 mg Subcutaneous Q24H  . losartan  50 mg Oral Daily  . magic mouthwash w/lidocaine  5 mL Oral QID  . oxyCODONE  20 mg Oral Q12H  . valACYclovir  1,000 mg Oral TID   Continuous Infusions: . piperacillin-tazobactam (ZOSYN)  IV 3.375 g (07/27/17 0849)     LOS: 1 day     Cordelia Poche, MD [facility] 07/27/2017, 12:03 PM Pager: [phone]  If 7PM-7AM, please contact night-coverage [email or web address] Password [email or web address] 07/27/2017, 12:03 PM

## 2017-07-28 DIAGNOSIS — R509 Fever, unspecified: Secondary | ICD-10-CM

## 2017-07-28 LAB — BASIC METABOLIC PANEL
Anion gap: 6 (ref 5–15)
BUN: <5 mg/dL — ABNORMAL LOW (ref 6–20)
CALCIUM: 8 mg/dL — AB (ref 8.9–10.3)
CO2: 24 mmol/L (ref 22–32)
CREATININE: 0.34 mg/dL — AB (ref 0.44–1.00)
Chloride: 108 mmol/L (ref 101–111)
Glucose, Bld: 97 mg/dL (ref 65–99)
Potassium: 3.2 mmol/L — ABNORMAL LOW (ref 3.5–5.1)
SODIUM: 138 mmol/L (ref 135–145)

## 2017-07-28 LAB — CBC WITH DIFFERENTIAL/PLATELET
BAND NEUTROPHILS: 3 %
BASOS ABS: 0.2 10*3/uL — AB (ref 0.0–0.1)
BASOS PCT: 2 %
EOS ABS: 0 10*3/uL (ref 0.0–0.7)
Eosinophils Relative: 0 %
HEMATOCRIT: 26 % — AB (ref 36.0–46.0)
HEMOGLOBIN: 9 g/dL — AB (ref 12.0–15.0)
LYMPHS ABS: 1.8 10*3/uL (ref 0.7–4.0)
LYMPHS PCT: 22 %
MCH: 32.4 pg (ref 26.0–34.0)
MCHC: 34.6 g/dL (ref 30.0–36.0)
MCV: 93.5 fL (ref 78.0–100.0)
METAMYELOCYTES PCT: 4 %
MONOS PCT: 5 %
Monocytes Absolute: 0.4 10*3/uL (ref 0.1–1.0)
Myelocytes: 13 %
Neutro Abs: 5.9 10*3/uL (ref 1.7–7.7)
Neutrophils Relative %: 51 %
PLATELETS: 183 10*3/uL (ref 150–400)
RBC: 2.78 MIL/uL — ABNORMAL LOW (ref 3.87–5.11)
RDW: 12.5 % (ref 11.5–15.5)
WBC MORPHOLOGY: INCREASED
WBC: 8.3 10*3/uL (ref 4.0–10.5)

## 2017-07-28 MED ORDER — MUPIROCIN 2 % EX OINT: 1.0000 "application " | TOPICAL_OINTMENT | Freq: Two times a day (BID) | CUTANEOUS | 0 refills | Status: DC

## 2017-07-28 MED ORDER — HEPARIN SOD (PORK) LOCK FLUSH 100 UNIT/ML IV SOLN
500.0000 [IU] | INTRAVENOUS | Status: AC | PRN
  Administered 2017-07-28: 500 [IU]

## 2017-07-28 MED ORDER — MAGIC MOUTHWASH W/LIDOCAINE: 5.0000 mL | Freq: Four times a day (QID) | ORAL | 0 refills | Status: DC | PRN

## 2017-07-28 MED ORDER — VALACYCLOVIR HCL 1 G PO TABS: 1000.0000 mg | ORAL_TABLET | Freq: Three times a day (TID) | ORAL | 0 refills | Status: AC

## 2017-07-28 MED ORDER — POTASSIUM CHLORIDE CRYS ER 20 MEQ PO TBCR
40.0000 meq | EXTENDED_RELEASE_TABLET | Freq: Once | ORAL | Status: AC
  Administered 2017-07-28: 40 meq via ORAL
  Filled 2017-07-28: qty 2

## 2017-07-28 MED ORDER — CLINDAMYCIN PHOSPHATE 1 % EX LOTN: TOPICAL_LOTION | Freq: Two times a day (BID) | CUTANEOUS | 0 refills | Status: DC

## 2017-07-28 NOTE — Discharge Summary (Signed)
Physician Discharge Summary  Lisa Fisher DPO:[id number] DOB: [birth year] DOA: 07/26/2017  PCP: Carol Ada, MD  Admit date: 07/26/2017 Discharge date: 07/28/2017  Admitted From: Home Disposition: Home  Recommendations for Outpatient Follow-up:  1. Follow up with PCP in 1 week 2. Follow up with oncologist in 1 week 3. Please obtain BMP/CBC in one week 4. Please follow up on the following pending results: Blood culture  Home Health: None Equipment/Devices: None  Discharge Condition: Stable CODE STATUS: Full code Diet recommendation: Heart healthy   Brief/Interim Summary:  Admission HPI written by Vashti Hey, MD   History of Present Illness:  Lisa Fisher is an 56 y.o. female with a past medical history significant for breast cancer who is undergoing palliative treatment at [facility] on THP therapy (Taxotere, Herceptin and Pertuzumab). She had her first dose 5 days ago and notes she felt extremely tired and malaise as was expected following 3 days. 2 days ago patient woke with a fever to 100.8 which defervesced within the hour. She subsequently noted onset of sores around her lips which she self treated with hydrogen peroxide. As the sores progressed she contacted her oncologist at [facility] Dr. Harden Mo who suggested she come in to [facility] for evaluation however she was unable to do so. She went to see her PCP who started her on acyclovir, Magic mouthwash and lidocaine cream with minimal improvement in pain. This morning patient woke to a fever 101.8 and markedly worsening of the sores in her mouth. She contacted Dr. Rayfield Citizen office again who advised her to come to the ED.  Patient admits to feeling clammy and chilly. She denies headache or photophobia, no cough or shortness of breath, no abdominal pain. She does admit to loose stools one a day for the past week but has not had any L movements for the past 2 days she has not had any by mouth's. Patient denies dysuria or back  pain or nausea or vomiting.  Patient denies any sores or pain or vaginal or anal area. She admits to pain on the pinna of her ear but denies any pain inside her ear. No problems with vision or with hearing.  ED Course:  The patient was treated with IV hydration and lidocaine swish and spit with some improvement in pain management. She was given cefepime for broad spectrum antibiotics for neutropenic fever.     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 Patient given magic mouth wash and Valtrex continued. Symptoms improved significantly. No evidence of superinfection  Pustules Located on right upper face. Given topical clindamycin. Avoided doxycycline secondary to risk of photosensitivity  Neutropenic fever ANC of 690 when calculated. With fevers. Vancomycin and zosyn started empirically. These subsided. Blood cultures no growth to date and urine culture with multiple species and no associated urinary symptoms. [place] significantly improved to 5900 prior to discharge. Empiric antibiotics discontinued. Patient was adamant about discharge and would not stay further for observation off of antibiotics. She will follow-up with her oncologist.  Hypokalemia Resolved with potassium containing IV fluids  Breast cancer Follows with Dr. Harden Mo at [facility]. Outpatient follow-up for this problem  Essential hypertension Continued losartan  GERD Continued PPI  Depression Continued duloxetine   Discharge Diagnoses:  Active Problems:   Mucositis due to chemotherapy   Benign essential HTN   GERD (gastroesophageal reflux disease)   Depression   Neutropenic fever (HCC)   Hypokalemia due to inadequate potassium intake    Discharge Instructions  Discharge Instructions  Call MD for:  difficulty breathing, headache or visual disturbances    Complete by:  As directed    Call MD for:  extreme fatigue    Complete by:  As directed    Call MD for:  hives     Complete by:  As directed    Call MD for:  persistant dizziness or light-headedness    Complete by:  As directed    Call MD for:  persistant nausea and vomiting    Complete by:  As directed    Call MD for:  temperature >100.4    Complete by:  As directed    Diet - low sodium heart healthy    Complete by:  As directed    Increase activity slowly    Complete by:  As directed      Allergies as of 07/28/2017      Reactions   Levofloxacin Other (See Comments)   Tendonitis   Naproxen Rash   Tape Rash   Adhesive tape allergy,paper tape ok      Medication List    TAKE these medications   albuterol 108 (90 Base) MCG/ACT inhaler Commonly known as:  PROVENTIL HFA;VENTOLIN HFA Inhale 2 puffs into the lungs every 4 (four) hours as needed for wheezing.   ascorbic acid 500 MG tablet Commonly known as:  VITAMIN C Take 500 mg by mouth daily.   calcium-vitamin D 500-200 MG-UNIT tablet Commonly known as:  OSCAL WITH D Take 1 tablet by mouth daily with breakfast.   clindamycin 1 % lotion Commonly known as:  CLEOCIN T Apply topically 2 (two) times daily.   docetaxel 20 MG/0.5ML injection Commonly known as:  TAXOTERE Inject into the vein.   docusate sodium 100 MG capsule Commonly known as:  COLACE Take 100 mg by mouth 2 (two) times daily.   DULoxetine 30 MG capsule Commonly known as:  CYMBALTA Take 30 mg by mouth daily.   lidocaine-prilocaine cream Commonly known as:  EMLA Apply 1 application topically as needed.   losartan 50 MG tablet Commonly known as:  COZAAR Take 50 mg by mouth daily.   magic mouthwash w/lidocaine Soln Take 5 mLs by mouth 4 (four) times daily as needed for mouth pain.   magnesium oxide 400 MG tablet Commonly known as:  MAG-OX Take 400 mg by mouth daily.   mupirocin ointment 2 % Commonly known as:  BACTROBAN Place 1 application into the nose 2 (two) times daily. Four days   omeprazole 20 MG capsule Commonly known as:  PRILOSEC Take 20 mg by  mouth daily.   Oxycodone HCl 20 MG Tabs Take 20 mg by mouth 2 (two) times daily.   Oxycodone HCl 10 MG Tabs Take 10 mg by mouth every 6 (six) hours as needed.   PERTUZUMAB IV Inject into the vein.   senna 8.6 MG tablet Commonly known as:  SENOKOT Take 1 tablet by mouth daily.   trastuzumab 150 MG Solr injection Commonly known as:  HERCEPTIN Inject into the vein.   valACYclovir 1000 MG tablet Commonly known as:  VALTREX Take 1 tablet (1,000 mg total) by mouth 3 (three) times daily.            Discharge Care Instructions        Start     Ordered   07/28/17 0000  magic mouthwash w/lidocaine SOLN  4 times daily PRN    Comments:  .1 Part viscous lidocaine 2% .1 Part Maalox. .1 Part diphenhydramine 12.5 mg per  5 ml elixir   07/28/17 1249   07/28/17 0000  mupirocin ointment (BACTROBAN) 2 %  2 times daily     07/28/17 1249   07/28/17 0000  valACYclovir (VALTREX) 1000 MG tablet  3 times daily     07/28/17 1249   07/28/17 0000  clindamycin (CLEOCIN T) 1 % lotion  2 times daily     07/28/17 1249   07/28/17 0000  Increase activity slowly     07/28/17 1249   07/28/17 0000  Diet - low sodium heart healthy     07/28/17 1249   07/28/17 0000  Call MD for:  temperature >100.4     07/28/17 1249   07/28/17 0000  Call MD for:  persistant nausea and vomiting     07/28/17 1249   07/28/17 0000  Call MD for:  difficulty breathing, headache or visual disturbances     07/28/17 1249   07/28/17 0000  Call MD for:  hives     07/28/17 1249   07/28/17 0000  Call MD for:  persistant dizziness or light-headedness     07/28/17 1249   07/28/17 0000  Call MD for:  extreme fatigue     07/28/17 1249     Follow-up Information    Carol Ada, MD. Schedule an appointment as soon as possible for a visit in 1 week(s).   Specialty:  Family Medicine Contact information: [place] [address] [phone]        Darral Dash, MD. Schedule an  appointment as soon as possible for a visit in 1 week(s).   Specialty:  Internal Medicine Contact information: [facility] [address] [place] [address] [phone]          Allergies  Allergen Reactions  . Levofloxacin Other (See Comments)    Tendonitis  . Naproxen Rash  . Tape Rash    Adhesive tape allergy,paper tape ok    Consultations:  None   Procedures/Studies: Dg Chest 2 View  Result Date: 07/26/2017 CLINICAL DATA:  Fever.  Chemotherapy. EXAM: CHEST  2 VIEW COMPARISON:  None. FINDINGS: There is a right-sided line terminating in the central SVC. The heart, hila, and mediastinum are normal. No pneumothorax. No nodules or masses. No focal infiltrates. IMPRESSION: No cause for fever is identified. Electronically Signed   By: Dorise Bullion III M.D   On: 07/26/2017 13:27      Subjective: Swallowing significantly improved.  Discharge Exam: Vitals:   07/27/17 2130 07/28/17 0603  BP: (!) 150/70 (!) 162/79  Pulse: 83 86  Resp: 20 20  Temp: 100.2 F (37.9 C) 98.7 F (37.1 C)  SpO2: 100% 100%   Vitals:   07/27/17 0453 07/27/17 1418 07/27/17 2130 07/28/17 0603  BP: 128/66 125/73 (!) 150/70 (!) 162/79  Pulse: 97 98 83 86  Resp: 19 20 20 20   Temp: (!) 100.6 F (38.1 C) 98.6 F (37 C) 100.2 F (37.9 C) 98.7 F (37.1 C)  TempSrc: Oral Oral Oral Oral  SpO2: 97% 100% 100% 100%  Weight:      Height:        General: Pt is alert, awake, not in acute distress ENT: healing crusted lesions over lips Cardiovascular: RRR, S1/S2 +, no rubs, no gallops Respiratory: CTA bilaterally, no wheezing, no rhonchi Abdominal: Soft, NT, ND, bowel sounds + Extremities: no edema, no cyanosis Skin: multiple pustules on right side of face. Nodule on pinna of left ear that is tender  The results of significant diagnostics from this hospitalization (including imaging, microbiology, ancillary and laboratory) are listed below for reference.     Microbiology: Recent  Results (from the past 240 hour(s))  Urine culture     Status: Abnormal   Collection Time: 07/26/17 11:50 AM  Result Value Ref Range Status   Specimen Description URINE, CLEAN CATCH  Final   Special Requests NONE  Final   Culture MULTIPLE SPECIES PRESENT, SUGGEST RECOLLECTION (A)  Final   Report Status 07/27/2017 FINAL  Final  Culture, blood (Routine x 2)     Status: None (Preliminary result)   Collection Time: 07/26/17 12:07 PM  Result Value Ref Range Status   Specimen Description BLOOD RIGHT FOREARM  Final   Special Requests   Final    BOTTLES DRAWN AEROBIC AND ANAEROBIC Blood Culture adequate volume   Culture NO GROWTH 2 DAYS  Final   Report Status PENDING  Incomplete  Culture, blood (Routine x 2)     Status: None (Preliminary result)   Collection Time: 07/26/17  2:14 PM  Result Value Ref Range Status   Specimen Description BLOOD RIGHT ANTECUBITAL  Final   Special Requests IN PEDIATRIC BOTTLE Blood Culture adequate volume  Final   Culture NO GROWTH 2 DAYS  Final   Report Status PENDING  Incomplete  MRSA PCR Screening     Status: Abnormal   Collection Time: 07/27/17  6:30 AM  Result Value Ref Range Status   MRSA by PCR POSITIVE (A) NEGATIVE Final    Comment:        The GeneXpert MRSA Assay (FDA approved for NASAL specimens only), is one component of a comprehensive MRSA colonization surveillance program. It is not intended to diagnose MRSA infection nor to guide or monitor treatment for MRSA infections. RESULT CALLED TO, READ BACK BY AND VERIFIED WITH: JOHNSON,R @ [id number] ON [id number] BY POTEAT,S      Labs: BNP (last 3 results) No results for input(s): BNP in the last 8760 hours. Basic Metabolic Panel:  Recent Labs Lab 07/26/17 1207 07/26/17 2052 07/27/17 0452 07/28/17 0354  NA 132* 132* 133* 138  K 2.7* 3.2* 4.1 3.2*  CL 95* 102 104 108  CO2 24 23 23 24   GLUCOSE 106* 146* 90 97  BUN 13 10 7  <5*  CREATININE 0.60 0.40* 0.39* 0.34*  CALCIUM 8.8* 7.9* 7.5* 8.0*   MG 1.5*  --   --   --    Liver Function Tests:  Recent Labs Lab 07/26/17 1207 07/27/17 0452  AST 27 20  ALT 17 13*  ALKPHOS 84 64  BILITOT 0.5 0.4  PROT 7.6 5.7*  ALBUMIN 3.9 2.6*   No results for input(s): LIPASE, AMYLASE in the last 168 hours. No results for input(s): AMMONIA in the last 168 hours. CBC:  Recent Labs Lab 07/26/17 1207 07/27/17 1307 07/28/17 0354  WBC 3.0* 7.0 8.3  NEUTROABS 1.0* 4.3 5.9  HGB 10.7* 9.2* 9.0*  HCT 31.0* 26.1* 26.0*  MCV 93.9 93.9 93.5  PLT 242 218 183   Cardiac Enzymes: No results for input(s): CKTOTAL, CKMB, CKMBINDEX, TROPONINI in the last 168 hours. BNP: Invalid input(s): POCBNP CBG: No results for input(s): GLUCAP in the last 168 hours. D-Dimer No results for input(s): DDIMER in the last 72 hours. Hgb A1c No results for input(s): HGBA1C in the last 72 hours. Lipid Profile No results for input(s): CHOL, HDL, LDLCALC, TRIG, CHOLHDL, LDLDIRECT in the last 72 hours. Thyroid function studies No results for input(s): TSH,  T4TOTAL, T3FREE, THYROIDAB in the last 72 hours.  Invalid input(s): FREET3 Anemia work up No results for input(s): VITAMINB12, FOLATE, FERRITIN, TIBC, IRON, RETICCTPCT in the last 72 hours. Urinalysis    Component Value Date/Time   COLORURINE AMBER (A) 07/26/2017 1150   APPEARANCEUR HAZY (A) 07/26/2017 1150   LABSPEC 1.027 07/26/2017 1150   PHURINE 5.0 07/26/2017 1150   GLUCOSEU NEGATIVE 07/26/2017 1150   HGBUR NEGATIVE 07/26/2017 [place] 07/26/2017 1150   KETONESUR 5 (A) 07/26/2017 1150   PROTEINUR 30 (A) 07/26/2017 1150   NITRITE NEGATIVE 07/26/2017 1150   LEUKOCYTESUR MODERATE (A) 07/26/2017 1150   Sepsis Labs Invalid input(s): PROCALCITONIN,  WBC,  LACTICIDVEN Microbiology Recent Results (from the past 240 hour(s))  Urine culture     Status: Abnormal   Collection Time: 07/26/17 11:50 AM  Result Value Ref Range Status   Specimen Description URINE, CLEAN CATCH  Final    Special Requests NONE  Final   Culture MULTIPLE SPECIES PRESENT, SUGGEST RECOLLECTION (A)  Final   Report Status 07/27/2017 FINAL  Final  Culture, blood (Routine x 2)     Status: None (Preliminary result)   Collection Time: 07/26/17 12:07 PM  Result Value Ref Range Status   Specimen Description BLOOD RIGHT FOREARM  Final   Special Requests   Final    BOTTLES DRAWN AEROBIC AND ANAEROBIC Blood Culture adequate volume   Culture NO GROWTH 2 DAYS  Final   Report Status PENDING  Incomplete  Culture, blood (Routine x 2)     Status: None (Preliminary result)   Collection Time: 07/26/17  2:14 PM  Result Value Ref Range Status   Specimen Description BLOOD RIGHT ANTECUBITAL  Final   Special Requests IN PEDIATRIC BOTTLE Blood Culture adequate volume  Final   Culture NO GROWTH 2 DAYS  Final   Report Status PENDING  Incomplete  MRSA PCR Screening     Status: Abnormal   Collection Time: 07/27/17  6:30 AM  Result Value Ref Range Status   MRSA by PCR POSITIVE (A) NEGATIVE Final    Comment:        The GeneXpert MRSA Assay (FDA approved for NASAL specimens only), is one component of a comprehensive MRSA colonization surveillance program. It is not intended to diagnose MRSA infection nor to guide or monitor treatment for MRSA infections. RESULT CALLED TO, READ BACK BY AND VERIFIED WITH: JOHNSON,R @ [place] [id number] BY POTEAT,S      SIGNED:   Cordelia Poche, MD [facility] 07/28/2017, 12:57 PM Pager [phone]  If 7PM-7AM, please contact night-coverage [email or web address] Password [email or web address]

## 2017-07-28 NOTE — Discharge Instructions (Addendum)
I have prescribed clindamycin topical rather than doxycycline (can increase photosensitivity) for your pustules on your face. Please use this for the next week and follow-up with your physician. You will also be discharged on Valtrex. Please complete this course of antiviral (you have a prescription at home, but I have given a new prescription to complete a total of 5 days).   Neutropenic Fever Neutropenic fever is a type of fever that can develop in someone who has a very low number of a certain kind of white blood cells called neutrophils (neutropenia). These blood cells are important for fighting infections caused by bacteria and fungi. When you have neutropenia, you could be in danger of a severe infection. You may need to start taking antibiotic medicines. What are the causes? Neutrophils are made in the spongy tissue inside your bones (bone marrow). Anything that damages your bone marrow or damages neutrophils after they leave your bone marrow can cause neutropenia. Once you have a dangerously low level of neutrophils, you are at risk for infection and neutropenic fever. Causes of neutropenia may include:  Cancer treatments.  Bone marrow cancer.  Cancer of the white blood cells (leukemia or myeloma).  Severe infection.  Bone marrow failure (aplastic anemia).  Many types of medicines.  Diseases of the bodys defense system (autoimmune diseases).  Inherited genes that cause neutropenia.  Vitamin B deficiency.  Spleen enlargement in rheumatoid arthritis (Felty syndrome).  What are the signs or symptoms? Fever is the main symptom of neutropenic fever. Other signs and symptoms may include:  Chills.  Fatigue.  Painful mouth ulcers.  Cough.  Shortness of breath.  Swollen glands (lymph nodes).  Sore throat.  Sinus and ear infections.  Gum disease.  Skin infection.  Burning and frequent urination.  Rectal infections.  Vaginal discharge or itching.  How is this  diagnosed? Your health care provider may diagnose neutropenic fever if your neutrophil count is less than 500 neutrophils per microliter of blood and you have a fever of at least 100.64F (38.0C).  Blood tests and other tests that measure neutrophils will be done. These may include: ? A complete blood count (CBC) and a differential white blood count (WBC). ? Peripheral smear. This test involves checking a blood sample under a microscope.  Other types of tests may also be done, including: ? Chest X-rays. ? Cultures of blood and body fluids to look for a source of infection.  Your health care provider will also determine if your neutropenic fever is high risk or low risk.  You may have high-risk neutropenic fever if: ? Your neutrophil count is less than 100 neutrophils per microliter of blood. ? You have also been diagnosed with pneumonia or another serious medical problem. ? Your condition requires you to be treated in the hospital.  You may have low-risk neutropenic fever if: ? Your neutrophil count is more than 100 neutrophils per microliter of blood. ? Your chest X-ray is normal. ? You do not have an active illness or any other problems that require you to be in the hospital.  How is this treated? You may start treatment as soon as you get diagnosed with neutropenic fever, even if your health care provider is still looking for the source of infection.  Treatment for high-risk neutropenic fever is antibiotic medicine given through an IV access tube. This is done in the hospital. You may be given a single antibiotic or a combination of antibiotics.  Low-risk neutropenic fever may be treated at home.  You may have to take one or two different oral antibiotics. In some cases, you may need to be treated with IV antibiotics that are given by a home health care provider who visits your home.  If your health care provider finds a specific cause of infection, you may be switched to the  antibiotics that work best against those particular bacteria.  If a fungal infection is found, your medicine will be changed to an antifungal medicine.  If the fever goes away in 3-5 days, you may have to take medicine for about 7 days. If the fever is not responding, you may have to take medicine longer.  You may have to stop taking any medicine that could be causing neutropenic fever.  If you have neutropenic fever from cancer treatment drugs (chemotherapy), you may need to take a type of medicine called white blood cell growth factors. This medicine can help prevent fever.  Follow these instructions at home:  Only take medicines as directed by your health care provider. ? If you are being treated with oral antibiotics at home, you may need to return to your health care provider every day to have your CBC checked. You may have to do this until your fever responds. ? Take your antibiotics as directed. Make sure you finish them even if you start to feel better.  Preventing infection is important when you have neutropenia. Here are some ways to prevent infections: ? Avoid sick friends and family members. ? Wash your hands often. ? Do noteat uncooked or undercooked meats. ? Wash all fruits and vegetables. ? Do not eat or drink unpasteurized dairy products. ? Get regular dental care, and maintain good dental hygiene. ? Get a flu shot. Ask your health care provider whether you need any other vaccines. ? Wear gloves when gardening.  Follow up with your health care provider as directed. Contact a health care provider if:  You have chills.  You have a fever.  You have signs or symptoms of infection. Get help right away if:  You have trouble breathing.  You have chest pain. This information is not intended to replace advice given to you by your health care provider. Make sure you discuss any questions you have with your health care provider. Document Released: 11/25/2013 Document  Revised: 04/27/2016 Document Reviewed: 05/24/2016 Elsevier Interactive Patient Education  [employer].

## 2017-07-31 LAB — CULTURE, BLOOD (ROUTINE X 2)
CULTURE: NO GROWTH
Culture: NO GROWTH
SPECIAL REQUESTS: ADEQUATE
Special Requests: ADEQUATE

## 2017-08-07 DIAGNOSIS — K121 Other forms of stomatitis: Secondary | ICD-10-CM | POA: Diagnosis not present

## 2017-08-07 DIAGNOSIS — R600 Localized edema: Secondary | ICD-10-CM | POA: Diagnosis not present

## 2017-08-07 DIAGNOSIS — Z79891 Long term (current) use of opiate analgesic: Secondary | ICD-10-CM | POA: Diagnosis not present

## 2017-08-07 DIAGNOSIS — Z853 Personal history of malignant neoplasm of breast: Secondary | ICD-10-CM | POA: Diagnosis not present

## 2017-08-07 DIAGNOSIS — K921 Melena: Secondary | ICD-10-CM | POA: Diagnosis not present

## 2017-08-07 DIAGNOSIS — Z5112 Encounter for antineoplastic immunotherapy: Secondary | ICD-10-CM | POA: Diagnosis not present

## 2017-08-07 DIAGNOSIS — C778 Secondary and unspecified malignant neoplasm of lymph nodes of multiple regions: Secondary | ICD-10-CM | POA: Diagnosis not present

## 2017-08-07 DIAGNOSIS — C7951 Secondary malignant neoplasm of bone: Secondary | ICD-10-CM | POA: Diagnosis not present

## 2017-08-07 DIAGNOSIS — Z803 Family history of malignant neoplasm of breast: Secondary | ICD-10-CM | POA: Diagnosis not present

## 2017-08-07 DIAGNOSIS — R0789 Other chest pain: Secondary | ICD-10-CM | POA: Diagnosis not present

## 2017-08-28 DIAGNOSIS — R0789 Other chest pain: Secondary | ICD-10-CM | POA: Diagnosis not present

## 2017-08-28 DIAGNOSIS — C7951 Secondary malignant neoplasm of bone: Secondary | ICD-10-CM | POA: Diagnosis not present

## 2017-08-28 DIAGNOSIS — Z170 Estrogen receptor positive status [ER+]: Secondary | ICD-10-CM | POA: Diagnosis not present

## 2017-08-28 DIAGNOSIS — K121 Other forms of stomatitis: Secondary | ICD-10-CM | POA: Diagnosis not present

## 2017-08-28 DIAGNOSIS — R609 Edema, unspecified: Secondary | ICD-10-CM | POA: Diagnosis not present

## 2017-08-28 DIAGNOSIS — T451X5A Adverse effect of antineoplastic and immunosuppressive drugs, initial encounter: Secondary | ICD-10-CM | POA: Diagnosis not present

## 2017-08-28 DIAGNOSIS — Z5112 Encounter for antineoplastic immunotherapy: Secondary | ICD-10-CM | POA: Diagnosis not present

## 2017-08-28 DIAGNOSIS — Z923 Personal history of irradiation: Secondary | ICD-10-CM | POA: Diagnosis not present

## 2017-08-28 DIAGNOSIS — R072 Precordial pain: Secondary | ICD-10-CM | POA: Diagnosis not present

## 2017-08-28 DIAGNOSIS — C50912 Malignant neoplasm of unspecified site of left female breast: Secondary | ICD-10-CM | POA: Diagnosis not present

## 2017-08-28 DIAGNOSIS — Z5111 Encounter for antineoplastic chemotherapy: Secondary | ICD-10-CM | POA: Diagnosis not present

## 2017-08-28 DIAGNOSIS — Z9221 Personal history of antineoplastic chemotherapy: Secondary | ICD-10-CM | POA: Diagnosis not present

## 2017-09-18 DIAGNOSIS — R0789 Other chest pain: Secondary | ICD-10-CM | POA: Diagnosis not present

## 2017-09-18 DIAGNOSIS — C7951 Secondary malignant neoplasm of bone: Secondary | ICD-10-CM | POA: Diagnosis not present

## 2017-09-18 DIAGNOSIS — Z807 Family history of other malignant neoplasms of lymphoid, hematopoietic and related tissues: Secondary | ICD-10-CM | POA: Diagnosis not present

## 2017-09-18 DIAGNOSIS — Z5111 Encounter for antineoplastic chemotherapy: Secondary | ICD-10-CM | POA: Diagnosis not present

## 2017-09-18 DIAGNOSIS — T451X5A Adverse effect of antineoplastic and immunosuppressive drugs, initial encounter: Secondary | ICD-10-CM | POA: Diagnosis not present

## 2017-09-18 DIAGNOSIS — R070 Pain in throat: Secondary | ICD-10-CM | POA: Diagnosis not present

## 2017-09-18 DIAGNOSIS — Z5112 Encounter for antineoplastic immunotherapy: Secondary | ICD-10-CM | POA: Diagnosis not present

## 2017-09-18 DIAGNOSIS — R600 Localized edema: Secondary | ICD-10-CM | POA: Diagnosis not present

## 2017-09-18 DIAGNOSIS — Z803 Family history of malignant neoplasm of breast: Secondary | ICD-10-CM | POA: Diagnosis not present

## 2017-09-18 DIAGNOSIS — C50912 Malignant neoplasm of unspecified site of left female breast: Secondary | ICD-10-CM | POA: Diagnosis not present

## 2017-09-18 DIAGNOSIS — Z923 Personal history of irradiation: Secondary | ICD-10-CM | POA: Diagnosis not present

## 2017-09-18 DIAGNOSIS — Z9221 Personal history of antineoplastic chemotherapy: Secondary | ICD-10-CM | POA: Diagnosis not present

## 2017-09-18 DIAGNOSIS — Z171 Estrogen receptor negative status [ER-]: Secondary | ICD-10-CM | POA: Diagnosis not present

## 2017-09-18 DIAGNOSIS — K121 Other forms of stomatitis: Secondary | ICD-10-CM | POA: Diagnosis not present

## 2017-09-18 DIAGNOSIS — G893 Neoplasm related pain (acute) (chronic): Secondary | ICD-10-CM | POA: Diagnosis not present

## 2017-09-18 DIAGNOSIS — Z1501 Genetic susceptibility to malignant neoplasm of breast: Secondary | ICD-10-CM | POA: Diagnosis not present

## 2017-10-09 DIAGNOSIS — Z5111 Encounter for antineoplastic chemotherapy: Secondary | ICD-10-CM | POA: Diagnosis not present

## 2017-10-09 DIAGNOSIS — C7951 Secondary malignant neoplasm of bone: Secondary | ICD-10-CM | POA: Diagnosis not present

## 2017-10-09 DIAGNOSIS — R0789 Other chest pain: Secondary | ICD-10-CM | POA: Diagnosis not present

## 2017-10-09 DIAGNOSIS — Z853 Personal history of malignant neoplasm of breast: Secondary | ICD-10-CM | POA: Diagnosis not present

## 2017-10-09 DIAGNOSIS — Z23 Encounter for immunization: Secondary | ICD-10-CM | POA: Diagnosis not present

## 2017-10-09 DIAGNOSIS — Z170 Estrogen receptor positive status [ER+]: Secondary | ICD-10-CM | POA: Diagnosis not present

## 2017-10-09 DIAGNOSIS — Z5112 Encounter for antineoplastic immunotherapy: Secondary | ICD-10-CM | POA: Diagnosis not present

## 2017-10-19 ENCOUNTER — Inpatient Hospital Stay (HOSPITAL_COMMUNITY)
Admission: EM | Admit: 2017-10-19 | Discharge: 2017-10-21 | DRG: 872 | Disposition: A | Discharge status: Home / Self Care | Payer: BLUE CROSS/BLUE SHIELD | Attending: Family Medicine | Admitting: Family Medicine

## 2017-10-19 ENCOUNTER — Emergency Department (HOSPITAL_COMMUNITY): Payer: BLUE CROSS/BLUE SHIELD

## 2017-10-19 ENCOUNTER — Encounter (HOSPITAL_COMMUNITY): Payer: Self-pay | Admitting: Emergency Medicine

## 2017-10-19 DIAGNOSIS — F329 Major depressive disorder, single episode, unspecified: Secondary | ICD-10-CM | POA: Diagnosis present

## 2017-10-19 DIAGNOSIS — Z9221 Personal history of antineoplastic chemotherapy: Secondary | ICD-10-CM

## 2017-10-19 DIAGNOSIS — N390 Urinary tract infection, site not specified: Secondary | ICD-10-CM | POA: Diagnosis not present

## 2017-10-19 DIAGNOSIS — Z881 Allergy status to other antibiotic agents status: Secondary | ICD-10-CM | POA: Diagnosis not present

## 2017-10-19 DIAGNOSIS — F419 Anxiety disorder, unspecified: Secondary | ICD-10-CM | POA: Diagnosis not present

## 2017-10-19 DIAGNOSIS — I10 Essential (primary) hypertension: Secondary | ICD-10-CM | POA: Diagnosis present

## 2017-10-19 DIAGNOSIS — Z923 Personal history of irradiation: Secondary | ICD-10-CM | POA: Diagnosis not present

## 2017-10-19 DIAGNOSIS — F32A Depression, unspecified: Secondary | ICD-10-CM | POA: Diagnosis present

## 2017-10-19 DIAGNOSIS — A4151 Sepsis due to Escherichia coli [E. coli]: Secondary | ICD-10-CM | POA: Diagnosis not present

## 2017-10-19 DIAGNOSIS — A419 Sepsis, unspecified organism: Secondary | ICD-10-CM | POA: Diagnosis not present

## 2017-10-19 DIAGNOSIS — Z79899 Other long term (current) drug therapy: Secondary | ICD-10-CM

## 2017-10-19 DIAGNOSIS — K219 Gastro-esophageal reflux disease without esophagitis: Secondary | ICD-10-CM | POA: Diagnosis present

## 2017-10-19 DIAGNOSIS — G8929 Other chronic pain: Secondary | ICD-10-CM | POA: Diagnosis not present

## 2017-10-19 DIAGNOSIS — E861 Hypovolemia: Secondary | ICD-10-CM | POA: Diagnosis present

## 2017-10-19 DIAGNOSIS — E876 Hypokalemia: Secondary | ICD-10-CM | POA: Diagnosis not present

## 2017-10-19 DIAGNOSIS — C50919 Malignant neoplasm of unspecified site of unspecified female breast: Secondary | ICD-10-CM | POA: Diagnosis present

## 2017-10-19 DIAGNOSIS — R911 Solitary pulmonary nodule: Secondary | ICD-10-CM | POA: Diagnosis not present

## 2017-10-19 DIAGNOSIS — J45909 Unspecified asthma, uncomplicated: Secondary | ICD-10-CM | POA: Diagnosis present

## 2017-10-19 DIAGNOSIS — N3001 Acute cystitis with hematuria: Secondary | ICD-10-CM | POA: Diagnosis not present

## 2017-10-19 DIAGNOSIS — D638 Anemia in other chronic diseases classified elsewhere: Secondary | ICD-10-CM | POA: Diagnosis present

## 2017-10-19 DIAGNOSIS — C7951 Secondary malignant neoplasm of bone: Secondary | ICD-10-CM | POA: Diagnosis present

## 2017-10-19 DIAGNOSIS — E871 Hypo-osmolality and hyponatremia: Secondary | ICD-10-CM | POA: Diagnosis not present

## 2017-10-19 DIAGNOSIS — R918 Other nonspecific abnormal finding of lung field: Secondary | ICD-10-CM | POA: Diagnosis not present

## 2017-10-19 DIAGNOSIS — D649 Anemia, unspecified: Secondary | ICD-10-CM | POA: Diagnosis not present

## 2017-10-19 LAB — CBC WITH DIFFERENTIAL/PLATELET
BASOS ABS: 0 10*3/uL (ref 0.0–0.1)
Basophils Relative: 0 %
EOS ABS: 0 10*3/uL (ref 0.0–0.7)
EOS PCT: 0 %
HEMATOCRIT: 29.7 % — AB (ref 36.0–46.0)
Hemoglobin: 10.1 g/dL — ABNORMAL LOW (ref 12.0–15.0)
LYMPHS ABS: 0.7 10*3/uL (ref 0.7–4.0)
Lymphocytes Relative: 11 %
MCH: 30.3 pg (ref 26.0–34.0)
MCHC: 34 g/dL (ref 30.0–36.0)
MCV: 89.2 fL (ref 78.0–100.0)
MONO ABS: 2.5 10*3/uL — AB (ref 0.1–1.0)
Monocytes Relative: 39 %
NEUTROS PCT: 50 %
Neutro Abs: 3.2 10*3/uL (ref 1.7–7.7)
PLATELETS: 291 10*3/uL (ref 150–400)
RBC: 3.33 MIL/uL — AB (ref 3.87–5.11)
RDW: 15.1 % (ref 11.5–15.5)
WBC: 6.4 10*3/uL (ref 4.0–10.5)

## 2017-10-19 LAB — URINALYSIS, ROUTINE W REFLEX MICROSCOPIC
Bilirubin Urine: NEGATIVE
Glucose, UA: NEGATIVE mg/dL
KETONES UR: NEGATIVE mg/dL
Nitrite: POSITIVE — AB
PROTEIN: 30 mg/dL — AB
Specific Gravity, Urine: 1.011 (ref 1.005–1.030)
pH: 5 (ref 5.0–8.0)

## 2017-10-19 LAB — COMPREHENSIVE METABOLIC PANEL
ALBUMIN: 3.4 g/dL — AB (ref 3.5–5.0)
ALK PHOS: 127 U/L — AB (ref 38–126)
ALT: 15 U/L (ref 14–54)
ANION GAP: 12 (ref 5–15)
AST: 34 U/L (ref 15–41)
BILIRUBIN TOTAL: 0.3 mg/dL (ref 0.3–1.2)
BUN: 11 mg/dL (ref 6–20)
CALCIUM: 8.7 mg/dL — AB (ref 8.9–10.3)
CO2: 26 mmol/L (ref 22–32)
Chloride: 89 mmol/L — ABNORMAL LOW (ref 101–111)
Creatinine, Ser: 0.55 mg/dL (ref 0.44–1.00)
GLUCOSE: 129 mg/dL — AB (ref 65–99)
POTASSIUM: 4.1 mmol/L (ref 3.5–5.1)
Sodium: 127 mmol/L — ABNORMAL LOW (ref 135–145)
TOTAL PROTEIN: 6.9 g/dL (ref 6.5–8.1)

## 2017-10-19 LAB — LACTIC ACID, PLASMA: LACTIC ACID, VENOUS: 1.2 mmol/L (ref 0.5–1.9)

## 2017-10-19 LAB — MRSA PCR SCREENING: MRSA BY PCR: NEGATIVE

## 2017-10-19 LAB — PROTIME-INR
INR: 1.03
Prothrombin Time: 13.4 seconds (ref 11.4–15.2)

## 2017-10-19 LAB — I-STAT CG4 LACTIC ACID, ED
LACTIC ACID, VENOUS: 3.14 mmol/L — AB (ref 0.5–1.9)
LACTIC ACID, VENOUS: 2.04 mmol/L — AB (ref 0.5–1.9)

## 2017-10-19 MED ORDER — ONDANSETRON HCL 4 MG PO TABS: 4.0000 mg | ORAL_TABLET | Freq: Four times a day (QID) | ORAL | Status: DC | PRN

## 2017-10-19 MED ORDER — DOCUSATE SODIUM 100 MG PO CAPS
100.0000 mg | ORAL_CAPSULE | Freq: Two times a day (BID) | ORAL | Status: DC
  Administered 2017-10-19 – 2017-10-21 (×3): 100 mg via ORAL
  Filled 2017-10-19 (×4): qty 1

## 2017-10-19 MED ORDER — OXYCODONE HCL 5 MG PO TABS
10.0000 mg | ORAL_TABLET | Freq: Once | ORAL | Status: AC
  Administered 2017-10-19: 10 mg via ORAL
  Filled 2017-10-19: qty 2

## 2017-10-19 MED ORDER — OXYCODONE HCL ER 20 MG PO T12A
20.0000 mg | EXTENDED_RELEASE_TABLET | Freq: Two times a day (BID) | ORAL | Status: DC
  Administered 2017-10-19 – 2017-10-21 (×4): 20 mg via ORAL
  Filled 2017-10-19 (×4): qty 1

## 2017-10-19 MED ORDER — ENOXAPARIN SODIUM 40 MG/0.4ML ~~LOC~~ SOLN
40.0000 mg | SUBCUTANEOUS | Status: DC
  Administered 2017-10-19 – 2017-10-20 (×2): 40 mg via SUBCUTANEOUS
  Filled 2017-10-19 (×2): qty 0.4

## 2017-10-19 MED ORDER — ALBUTEROL SULFATE (2.5 MG/3ML) 0.083% IN NEBU: 3.0000 mL | INHALATION_SOLUTION | RESPIRATORY_TRACT | Status: DC | PRN

## 2017-10-19 MED ORDER — OXYCODONE HCL 5 MG PO TABS
10.0000 mg | ORAL_TABLET | ORAL | Status: DC | PRN
  Administered 2017-10-20 – 2017-10-21 (×8): 10 mg via ORAL
  Filled 2017-10-19 (×8): qty 2

## 2017-10-19 MED ORDER — ACETAMINOPHEN 325 MG PO TABS
650.0000 mg | ORAL_TABLET | Freq: Four times a day (QID) | ORAL | Status: DC | PRN
  Administered 2017-10-19 – 2017-10-20 (×4): 650 mg via ORAL
  Filled 2017-10-19 (×4): qty 2

## 2017-10-19 MED ORDER — SENNA 8.6 MG PO TABS
1.0000 | ORAL_TABLET | Freq: Every day | ORAL | Status: DC
  Administered 2017-10-21: 8.6 mg via ORAL
  Filled 2017-10-19 (×2): qty 1

## 2017-10-19 MED ORDER — CLINDAMYCIN PHOSPHATE 1 % EX LOTN: TOPICAL_LOTION | Freq: Two times a day (BID) | CUTANEOUS | Status: DC | PRN

## 2017-10-19 MED ORDER — PANTOPRAZOLE SODIUM 40 MG PO TBEC
40.0000 mg | DELAYED_RELEASE_TABLET | Freq: Every day | ORAL | Status: DC
  Administered 2017-10-20 – 2017-10-21 (×2): 40 mg via ORAL
  Filled 2017-10-19 (×2): qty 1

## 2017-10-19 MED ORDER — MAGIC MOUTHWASH W/LIDOCAINE: 5.0000 mL | Freq: Four times a day (QID) | ORAL | Status: DC | PRN

## 2017-10-19 MED ORDER — MAGNESIUM OXIDE 400 (241.3 MG) MG PO TABS
400.0000 mg | ORAL_TABLET | Freq: Every day | ORAL | Status: DC
  Administered 2017-10-20 – 2017-10-21 (×2): 400 mg via ORAL
  Filled 2017-10-19 (×2): qty 1

## 2017-10-19 MED ORDER — ONDANSETRON HCL 4 MG/2ML IJ SOLN
4.0000 mg | Freq: Four times a day (QID) | INTRAMUSCULAR | Status: DC | PRN
Start: 2017-10-19 — End: 2017-10-21

## 2017-10-19 MED ORDER — SODIUM CHLORIDE 0.9 % IV BOLUS (SEPSIS)
1000.0000 mL | Freq: Once | INTRAVENOUS | Status: AC
  Administered 2017-10-19: 1000 mL via INTRAVENOUS

## 2017-10-19 MED ORDER — SODIUM CHLORIDE 0.9 % IV SOLN
INTRAVENOUS | Status: AC
  Administered 2017-10-19 – 2017-10-20 (×2): via INTRAVENOUS

## 2017-10-19 MED ORDER — SENNOSIDES 8.6 MG PO TABS: 1.0000 | ORAL_TABLET | Freq: Every day | ORAL | Status: DC

## 2017-10-19 MED ORDER — ACETAMINOPHEN 650 MG RE SUPP
650.0000 mg | Freq: Four times a day (QID) | RECTAL | Status: DC | PRN
Start: 2017-10-19 — End: 2017-10-21

## 2017-10-19 MED ORDER — VANCOMYCIN HCL IN DEXTROSE 1-5 GM/200ML-% IV SOLN
1000.0000 mg | Freq: Once | INTRAVENOUS | Status: AC
  Administered 2017-10-19: 1000 mg via INTRAVENOUS
  Filled 2017-10-19: qty 200

## 2017-10-19 MED ORDER — DEXTROSE 5 % IV SOLN
1.0000 g | Freq: Two times a day (BID) | INTRAVENOUS | Status: DC
  Administered 2017-10-19 – 2017-10-21 (×4): 1 g via INTRAVENOUS
  Filled 2017-10-19 (×5): qty 1

## 2017-10-19 MED ORDER — DULOXETINE HCL 30 MG PO CPEP
30.0000 mg | ORAL_CAPSULE | Freq: Every day | ORAL | Status: DC
  Administered 2017-10-20 – 2017-10-21 (×2): 30 mg via ORAL
  Filled 2017-10-19 (×2): qty 1

## 2017-10-19 MED ORDER — PIPERACILLIN-TAZOBACTAM 3.375 G IVPB 30 MIN
3.3750 g | Freq: Once | INTRAVENOUS | Status: AC
  Administered 2017-10-19: 3.375 g via INTRAVENOUS
  Filled 2017-10-19: qty 50

## 2017-10-19 NOTE — ED Notes (Signed)
Patient transported to X-ray 

## 2017-10-19 NOTE — ED Notes (Addendum)
PT DECLINES 2ND IV AT THIS TIME. AWARE 1ST BLOOD CULTURE OBTAINED BY [facility] EMT DENIED FROM LAB. WILL CONTINUE WITH ANTIBIOTICS AT THIS TIME.

## 2017-10-19 NOTE — ED Notes (Signed)
HAVE PAGED ADMITTING MD THREE TIMES. AWAITING RESPONSE

## 2017-10-19 NOTE — ED Notes (Signed)
3 [facility] RN MADE AWARE OF CURRENT VITALS. ADMISSION MD PAGED

## 2017-10-19 NOTE — ED Provider Notes (Signed)
[facility] DEPT Provider Note   CSN: [id number] Arrival date & time: 10/19/17  1352     History   Chief Complaint Chief Complaint  Patient presents with  . Fever  . chemo patient    HPI Lisa Fisher is a 56 y.o. 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 Harden Mo at [facility] presents to ED for evaluation of fever onset this morning as high as 102 F. Last took Tylenol 2 hours PTA. Reports intermittent in "discomfort" with urination. 3-4 days ago she had urinary urgency and she was unable to make it to bathroom in time and urinated on herself. No previous h/o UTI. She has chronic sternal pain from cancer unchanged, no new chest pain. Denies headache, nasal congestion, rhinorrhea, new mouth sores, sore throat, neck pain/rigidity, SOB, cough, vomiting, diarrhea, constipation, abdominal pain, abnormal vaginal discharge or bleeding. Last chemo infusion 10 days ago.   HPI  Past Medical History:  Diagnosis Date  . Allergy   . Anxiety   . Asthma   . Breast cancer ([place]) 1999    left   . 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 Depression   . GERD (gastroesophageal reflux disease)   . History of chemotherapy    hx: adjuvant Adriamycin and cyclphosphamide x4,, tamoxifen 2 y,,zometa ,trastuzumab  . History of radiation therapy    to sternum  . Hot flashes    due to letrozole  . 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 Osteopenia     Patient Active Problem List   Diagnosis Date Noted  . Mucositis due to chemotherapy 07/26/2017  . Benign essential HTN 07/26/2017  . GERD (gastroesophageal reflux disease) 07/26/2017  . Depression 07/26/2017  . 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 Hypokalemia due to inadequate potassium intake 07/26/2017  . Breast cancer ([place])   . Metastatic  adenocarcinoma to bone ([place])   . Breast cancer metastasized to bone ([place]) 03/05/2003    Past Surgical History:  Procedure Laterality Date  . BREAST LUMPECTOMY Left 04/23/03   metastatic to sternum, with sentinel lymph node, ER/PR=+,Her2=+-invasive  . CHOLECYSTECTOMY  1998    OB History    No data available       Home Medications    Prior to Admission medications   Medication Sig Start Date End Date Taking? Authorizing Provider  albuterol (PROVENTIL HFA;VENTOLIN HFA) 108 (90 BASE) MCG/ACT inhaler Inhale 2 puffs into the lungs every 4 (four) hours as needed for wheezing.    [provider]  ascorbic acid (VITAMIN C) 500 MG tablet Take 500 mg by mouth daily.    [provider]  calcium-vitamin D (OSCAL WITH D) 500-200 MG-UNIT tablet Take 1 tablet by mouth daily with breakfast.    [provider]  clindamycin (CLEOCIN T) 1 % lotion Apply topically 2 (two) times daily. 07/28/17   Mariel Aloe, MD  docetaxel (TAXOTERE) 20 MG/0.5ML injection Inject into the vein.    [provider]  docusate sodium (COLACE) 100 MG capsule Take 100 mg by mouth 2 (two) times daily.    [provider]  DULoxetine (CYMBALTA) 30 MG capsule Take 30 mg by mouth daily.    [provider]  lidocaine-prilocaine (EMLA) cream Apply 1 application topically as needed.    [provider]  losartan (COZAAR) 50 MG tablet  Take 50 mg by mouth daily.    [provider]  magic mouthwash w/lidocaine SOLN Take 5 mLs by mouth 4 (four) times daily as needed for mouth pain. 07/28/17   Mariel Aloe, MD  magnesium oxide (MAG-OX) 400 MG tablet Take 400 mg by mouth daily.    [provider]  mupirocin ointment (BACTROBAN) 2 % Place 1 application into the nose 2 (two) times daily. Four days 07/28/17   Mariel Aloe, MD  omeprazole (PRILOSEC) 20 MG capsule Take 20 mg by mouth daily.    [provider]  Oxycodone HCl 10 MG TABS Take 10 mg by  mouth every 6 (six) hours as needed.    [provider]  Oxycodone HCl 20 MG TABS Take 20 mg by mouth 2 (two) times daily.    [provider]  PERTUZUMAB IV Inject into the vein.    [provider]  senna (SENOKOT) 8.6 MG tablet Take 1 tablet by mouth daily.    [provider]  trastuzumab (HERCEPTIN) 150 MG SOLR injection Inject into the vein.    [provider]    Family History Family History  Problem Relation Age of Onset  . Cancer Mother 61       breast    Social History Social History   Tobacco Use  . Smoking status: Never Smoker  . Smokeless tobacco: Never Used  Substance Use Topics  . Alcohol use: Yes    Alcohol/week: 6.0 oz    Types: 10 Glasses of wine per week    Comment: weekly  . Drug use: No     Allergies   Levofloxacin; Naproxen; and Tape   Review of Systems Review of Systems  Constitutional: Positive for fever.  Genitourinary: Positive for difficulty urinating.  Musculoskeletal: Positive for myalgias.  Allergic/Immunologic: Positive for immunocompromised state.  All other systems reviewed and are negative.    Physical Exam Updated Vital Signs BP (!) 113/55   Pulse 92   Temp 98.2 F (36.8 C) (Oral)   Resp (!) 21   Ht _0  (1.676 m)   Wt 59.9 kg (132 lb)   SpO2 99%   BMI 21.31 kg/m   Physical Exam  Constitutional: She is oriented to person, place, and time. She appears well-developed and well-nourished. No distress.  Nontoxic  HENT:  Head: Normocephalic and atraumatic.  Nose: Nose normal.  Mouth/Throat: No oropharyngeal exudate.  Normal nasal mucosa, oropharynx and tonsils without edema, erythema or exudates No mouth sores Moist mucous membranes  Eyes: Conjunctivae and EOM are normal. Pupils are equal, round, and reactive to light.  Neck: Normal range of motion. Neck supple.  Painless, passive range of motion of the neck without rigidity No meningeal signs  No cervical adenopathy    Cardiovascular: Regular rhythm, S1 normal, S2 normal, normal heart sounds and intact distal pulses. Tachycardia present.  No murmur heard. Pulses:      Radial pulses are 2+ on the right side, and 2+ on the left side.       Dorsalis pedis pulses are 2+ on the right side, and 2+ on the left side.  No LE edema Calves non tender w/o asymmetric edema  Pulmonary/Chest: Effort normal and breath sounds normal. She has no decreased breath sounds. She has no wheezes. She has no rhonchi. She has no rales. She exhibits tenderness.  Lungs CTAB Sternal tenderness    Abdominal: Soft. Bowel sounds are normal. She exhibits no distension. There is no tenderness.  No suprapubic or CVA tenderness No G/R/R  Musculoskeletal: Normal range of motion. She exhibits no deformity.  Neurological: She is alert and oriented to person, place, and time. No sensory deficit.  Skin: Skin is warm and dry. Capillary refill takes less than 2 seconds.  Psychiatric: She has a normal mood and affect. Her behavior is normal. Judgment and thought content normal.  Nursing note and vitals reviewed.    ED Treatments / Results  Labs (all labs ordered are listed, but only abnormal results are displayed) Labs Reviewed  COMPREHENSIVE METABOLIC PANEL - Abnormal; Notable for the following components:      Result Value   Sodium 127 (*)    Chloride 89 (*)    Glucose, Bld 129 (*)    Calcium 8.7 (*)    Albumin 3.4 (*)    Alkaline Phosphatase 127 (*)    All other components within normal limits  CBC WITH DIFFERENTIAL/PLATELET - Abnormal; Notable for the following components:   RBC 3.33 (*)    Hemoglobin 10.1 (*)    HCT 29.7 (*)    Monocytes Absolute 2.5 (*)    All other components within normal limits  URINALYSIS, ROUTINE W REFLEX MICROSCOPIC - Abnormal; Notable for the following components:   APPearance HAZY (*)    Hgb urine dipstick SMALL (*)    Protein, ur 30 (*)    Nitrite POSITIVE (*)    Leukocytes, UA LARGE (*)     Bacteria, UA FEW (*)    Squamous Epithelial / LPF 0-5 (*)    All other components within normal limits  I-STAT CG4 LACTIC ACID, ED - Abnormal; Notable for the following components:   Lactic Acid, Venous 3.14 (*)    All other components within normal limits  CULTURE, BLOOD (ROUTINE X 2)  CULTURE, BLOOD (ROUTINE X 2)  URINE CULTURE  PROTIME-INR  I-STAT CG4 LACTIC ACID, ED    EKG  EKG Interpretation  Date/Time:  Friday October 19 2017 15:24:32 EST Ventricular Rate:  93 PR Interval:    QRS Duration: 94 QT Interval:  402 QTC Calculation: 500 R Axis:   67 Text Interpretation:  Sinus rhythm Abnormal R-wave progression, early transition Borderline prolonged QT interval Baseline wander in lead(s) V4 No STEMI.  Confirmed by Nanda Quinton [phone]) on 10/19/2017 3:32:57 PM       Radiology Dg Chest 2 View  Result Date: 10/19/2017 CLINICAL DATA:  Fever EXAM: CHEST  2 VIEW COMPARISON:  July 26, 2017 FINDINGS: There are probable nipple shadows on each side. In addition, there is a 6 mm nodular opacity in the right lower lobe. There is no edema or consolidation. Heart size and pulmonary vascularity are normal. No adenopathy. Central catheter tip is in the superior vena cava. No pneumothorax. There is midthoracic dextroscoliosis with thoracolumbar levoscoliosis. There are surgical clips in left axilla. IMPRESSION: 6 mm nodular opacity right lower lobe. This finding was not present on prior study. This finding warrants noncontrast enhanced chest CT to further assess. There are in addition probable nipple shadows bilaterally. No edema or consolidation. No evident adenopathy. Central catheter tip in superior vena cava. No evident pneumothorax. Electronically Signed   By: Lowella Grip III M.D.   On: 10/19/2017 15:54    Procedures Procedures (including critical care time)  CRITICAL CARE Performed by: Kinnie Feil   Total critical care time: 35 minutes  Critical care time was exclusive  of separately billable procedures and treating other patients.  Critical care was necessary to treat  or prevent imminent or life-threatening deterioration.  Critical care was time spent personally by me on the following activities: development of treatment plan with patient and/or surrogate as well as nursing, discussions with consultants, evaluation of patient's response to treatment, examination of patient, obtaining history from patient or surrogate, ordering and performing treatments and interventions, ordering and review of laboratory studies, ordering and review of radiographic studies, pulse oximetry and re-evaluation of patient's condition.   Medications Ordered in ED Medications  vancomycin (VANCOCIN) IVPB 1000 mg/200 mL premix (1,000 mg Intravenous New Bag/Given 10/19/17 1540)  piperacillin-tazobactam (ZOSYN) IVPB 3.375 g (3.375 g Intravenous New Bag/Given 10/19/17 1530)  sodium chloride 0.9 % bolus 1,000 mL (1,000 mLs Intravenous New Bag/Given 10/19/17 1501)     Initial Impression / Assessment and Plan / ED Course  I have reviewed the triage vital signs and the nursing notes.  Pertinent labs & imaging results that were available during my care of the patient were reviewed by me and considered in my medical decision making (see chart for details).  Clinical Course as of Oct 19 1636  Fri Oct 19, 2017  1604 Lactic Acid, Venous: (!!) 3.14 [CG]  1604 Hgb urine dipstick: (!) SMALL [CG]  1604 Nitrite: (!) POSITIVE [CG]  1604 Leukocytes, UA: (!) LARGE [CG]  1604 WBC, UA: TOO NUMEROUS TO COUNT [CG]  1604 WBC: 6.4 [CG]  1604 Sodium: (!) 127 [CG]  1605 Spoke to patient regarding lab work up results. She does not want admission and would like to go home with antibiotics. She requests some time to speak to her husband.  [CG]  1614 IMPRESSION: 6 mm nodular opacity right lower lobe. This finding was not present on prior study. This finding warrants noncontrast enhanced chest CT to further  assess. There are in addition probable nipple shadows bilaterally.  No edema or consolidation. No evident adenopathy. Central catheter tip in superior vena cava. No evident pneumothorax. DG Chest 2 View [CG]  4585 Spoke to hospitalist who will admit patient.   [CG]    Clinical Course User Index [CG] Kinnie Feil, PA-C   56 yo female with h/o metastatic breast cancer on palliative chemotherapy presents for fever. She has urinary symptoms. Last chemo infusion 10 days ago.   On exam, she is tachycardic. Non toxic. Afebrile, but took tylenol 2 hours PTA. No signs of dehydration. Lungs CTAB. Abdomen soft NTND, no suprapubic or CVAT. No meningeal signs.   Lab work remarkable for elevated lactic acid, hyponatremia and UTI. Will request admission of urosepsis and hyponatremia. Pt has new finding of nodular opacity in RLL on CXR today.   Patient, ED treatment and discharge plan was discussed with supervising physician who also evaluated the patient and is agreeable with plan.   Final Clinical Impressions(s) / ED Diagnoses   Final diagnoses:  Hyponatremia  Acute cystitis with hematuria  Metastatic breast cancer [facility])    ED Discharge Orders    None          Kinnie Feil, PA-C 10/19/17 1637    Long, [facility], MD 10/19/17 1958

## 2017-10-19 NOTE — ED Notes (Signed)
EDPA Provider at bedside. 

## 2017-10-19 NOTE — H&P (Addendum)
History and Physical   Lisa Fisher HAL:[id number] DOB: 07/10/1961 DOA: 10/19/2017  Referring MD/NP/PA: Laverta Baltimore, EDP PCP: Carol Ada, MD Outpatient Specialists: [facility], Dr. Harden Mo  Patient coming from: Home  Chief Complaint: Chills, fever, urinary urgency and frequency  HPI: Lisa Fisher is a 56 y.o. female with a history of metastatic breast cancer on palliative chemotherapy, HTN, depression, and chronic pain who presented to the ED for fever and chills that began this morning. Upon waking she felt chills constantly for an hour that got worse, checked her oral temperature and found it to be 102F. She took a tylenol which helped and called her oncologist at [facility] who referred her to the nearest ED. On arrival she also reported a few days of urinary frequency and urgency and discomfort toward the end of urination, but denied cough, dyspnea, or sick contacts. Last chemo was 11/6, done every 3 weeks, after which she's had low level low back soreness improved with her stable oxycontin/oxycodone regimen. She was tachycardic, no longer febrile, with WBC 6.4 (ANC 3.2), lactic acid 3.14, Na 127, with normal creatinine and a clean catch urinalysis showing TNTC WBCs in clumps and nitrite positive bacteriuria. Vancomycin, zosyn, and 30cc/kg IV fluids were administered after blood cultures were drawn, urine culture sent, and hospitalists called for admission.   Review of Systems: She denies abdominal pain, N/V/D, chest pain, dyspnea, palpitations, headaches, rashes, myalgias, arthralgias. She has chronic mild ankle swelling at baseline currently, otherwise per HPI. All others reviewed and are negative.   Past Medical History:  Diagnosis Date  . Allergy   . Anxiety   . Asthma   . Breast cancer ([place]) 1999    left   . Breast cancer metastasized to bone ([place]) 03/2003   stage IV mets to sternum  . Depression   . GERD (gastroesophageal reflux disease)   . History of chemotherapy    hx: adjuvant  Adriamycin and cyclphosphamide x4,, tamoxifen 2 y,,zometa ,trastuzumab  . History of radiation therapy    to sternum  . Hot flashes    due to letrozole  . 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 Osteopenia    Past Surgical History:  Procedure Laterality Date  . BREAST LUMPECTOMY Left 04/23/03   metastatic to sternum, with sentinel lymph node, ER/PR=+,Her2=+-invasive  . [place], lives in [place] with her husband and cat. Currently unemployed and drinks moderately, not every day.   Allergies  Allergen Reactions  . Levofloxacin Other (See Comments)    Tendonitis  . Naproxen Rash  . Tape Rash    Adhesive tape allergy,paper tape ok   Family History  Problem Relation Age of Onset  . Cancer Mother 81       breast   - Family history otherwise reviewed and not pertinent.  Prior to Admission medications   Medication Sig Start Date End Date Taking? Authorizing Provider  albuterol (PROVENTIL HFA;VENTOLIN HFA) 108 (90 BASE) MCG/ACT inhaler Inhale 2 puffs into the lungs every 4 (four) hours as needed for wheezing.    [provider]  ascorbic acid (VITAMIN C) 500 MG tablet Take 500 mg by mouth daily.    [provider]  calcium-vitamin D (OSCAL WITH D) 500-200 MG-UNIT tablet Take 1 tablet by mouth daily with breakfast.    [provider]  clindamycin (CLEOCIN T) 1 % lotion Apply topically 2 (two) times daily. 07/28/17   Mariel Aloe, MD  docetaxel (TAXOTERE) 20 MG/0.5ML  injection Inject into the vein.    [provider]  docusate sodium (COLACE) 100 MG capsule Take 100 mg by mouth 2 (two) times daily.    [provider]  DULoxetine (CYMBALTA) 30 MG capsule Take 30 mg by mouth daily.    [provider]  lidocaine-prilocaine (EMLA) cream Apply 1 application topically as needed.    [provider]  losartan (COZAAR) 50 MG tablet Take 50 mg by mouth daily.    [provider]    magic mouthwash w/lidocaine SOLN Take 5 mLs by mouth 4 (four) times daily as needed for mouth pain. 07/28/17   Mariel Aloe, MD  magnesium oxide (MAG-OX) 400 MG tablet Take 400 mg by mouth daily.    [provider]  mupirocin ointment (BACTROBAN) 2 % Place 1 application into the nose 2 (two) times daily. Four days 07/28/17   Mariel Aloe, MD  omeprazole (PRILOSEC) 20 MG capsule Take 20 mg by mouth daily.    [provider]  Oxycodone HCl 10 MG TABS Take 10 mg by mouth every 6 (six) hours as needed.    [provider]  Oxycodone HCl 20 MG TABS Take 20 mg by mouth 2 (two) times daily.    [provider]  PERTUZUMAB IV Inject into the vein.    [provider]  senna (SENOKOT) 8.6 MG tablet Take 1 tablet by mouth daily.    [provider]  trastuzumab (HERCEPTIN) 150 MG SOLR injection Inject into the vein.    [provider]    Physical Exam: Vitals:   10/19/17 1403 10/19/17 1531 10/19/17 1551 10/19/17 1600  BP: 130/90 105/62 (!) 113/55 (!) 105/56  Pulse: (!) 108 94 92 (!) 101  Resp: 20 16 (!) 21 15  Temp: 98.6 F (37 C) 98.2 F (36.8 C)    TempSrc: Oral Oral    SpO2: 99% 100% 99% 98%  Weight: 59.9 kg (132 lb)     Height: 5' 6"  (1.676 m)      Constitutional: 56 y.o. female in no distress, calm demeanor Eyes: Lids and conjunctivae normal, PERRL ENMT: Mucous membranes are moist. Posterior pharynx clear of any exudate or lesions. Normal dentition.  Neck: normal, supple, no masses, no thyromegaly Respiratory: Non-labored breathing room air without accessory muscle use. Clear breath sounds to auscultation bilaterally Cardiovascular: Regular tachycardia, no murmurs, rubs, or gallops. No carotid bruits. No JVD. very scant trace ankle edema. 2+ pedal pulses. Abdomen: Normoactive bowel sounds. No tenderness, non-distended, and no masses palpated. No hepatosplenomegaly. GU: No indwelling catheter. Musculoskeletal: No clubbing /  cyanosis. No joint deformity upper and lower extremities. Good ROM, no contractures. Normal muscle tone. Paraspinal spasm in lumbar levels without palpable deformity or midline tenderness.  Skin: Warm, dry. Alopecia noted. Port site in [place] without erythema, discharge, or tenderness. Has mild light erythematous discoloration to right cheek without tenderness or warmth.  Neurologic: CN II-XII grossly intact. Gait narrow based and steady. Speech normal. No focal deficits in motor strength or sensation in all extremities.  Psychiatric: Alert and oriented x3. Normal judgment and insight. Mood depressed with broad affect.   CBC: Recent Labs  Lab 10/19/17 1325  WBC 6.4  NEUTROABS 3.2  HGB 10.1*  HCT 29.7*  MCV 89.2  PLT 025   Basic Metabolic Panel: Recent Labs  Lab 10/19/17 1325  NA 127*  K 4.1  CL 89*  CO2 26  GLUCOSE 129*  BUN 11  CREATININE 0.55  CALCIUM 8.7*  Liver Function Tests: Recent Labs  Lab 10/19/17 1325  AST 34  ALT 15  ALKPHOS 127*  BILITOT 0.3  PROT 6.9  ALBUMIN 3.4*   Urine analysis:    Component Value Date/Time   COLORURINE YELLOW 10/19/2017 1521   APPEARANCEUR HAZY (A) 10/19/2017 1521   LABSPEC 1.011 10/19/2017 1521   PHURINE 5.0 10/19/2017 1521   GLUCOSEU NEGATIVE 10/19/2017 1521   HGBUR SMALL (A) 10/19/2017 1521   BILIRUBINUR NEGATIVE 10/19/2017 1521   KETONESUR NEGATIVE 10/19/2017 1521   PROTEINUR 30 (A) 10/19/2017 1521   NITRITE POSITIVE (A) 10/19/2017 1521   LEUKOCYTESUR LARGE (A) 10/19/2017 1521   Radiological Exams on Admission: Dg Chest 2 View  Result Date: 10/19/2017 CLINICAL DATA:  Fever EXAM: CHEST  2 VIEW COMPARISON:  July 26, 2017 FINDINGS: There are probable nipple shadows on each side. In addition, there is a 6 mm nodular opacity in the right lower lobe. There is no edema or consolidation. Heart size and pulmonary vascularity are normal. No adenopathy. Central catheter tip is in the superior vena cava. No pneumothorax. There is  midthoracic dextroscoliosis with thoracolumbar levoscoliosis. There are surgical clips in left axilla. IMPRESSION: 6 mm nodular opacity right lower lobe. This finding was not present on prior study. This finding warrants noncontrast enhanced chest CT to further assess. There are in addition probable nipple shadows bilaterally. No edema or consolidation. No evident adenopathy. Central catheter tip in superior vena cava. No evident pneumothorax. Electronically Signed   By: Lowella Grip III M.D.   On: 10/19/2017 15:54    EKG: Independently reviewed. NSR with diffuse artifact without ST/T wave changes indicating ischemia.  Assessment/Plan Active Problems:   Sepsis due to urinary tract infection ([place])   Sepsis due to UTI: With tachypnea, tachycardia, and relative hypotension. Also has lactate elevation and symptomatic pyuria. Received vanc/zosyn and had severe tendonitis as a result of fluoroquinolones in the past. - Change to cefepime monotherapy pending urine culture results - Complete final 700cc of 30cc/kg IV fluids and recheck lactate, currently improving. Pt appears hemodynamically stabilized.  - Tylenol prn fever.   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 Harden Mo, though fortunately not neutropenic.  - Continue home pain regimen (oxycontin + oxyIR prn breakthrough pain) for malignancy-associated pain. Continue bowel regimen. Zofran prn nausea/vomiting.  - Alkaline phosphatase elevation thought to be due to known bony metastases.   3m RLL pulmonary nodule: Incidentally noted on CXR. Discussed with patient who prefers to "not know" what this is at this time. Would like to forego recommended chest CT at this time. Since there are no alternative indications for this study, we will defer this to her outpatient oncologist, who was planning on rescanning next month anyway.   HTN: Chronic, stable. - Restart  antihypertensive in AM if needed. Will hold in setting of sepsis for now.   Depression: Chronic, stable.  - Continue celebrex in AM.   GERD: Chronic, stable. - Continue PPI  Hyponatremia: Presumed hypovolemic in setting of sepsis.  - Monitor in AM with isotonic fluids.  DVT prophylaxis: Lovenox  Code Status: Full, confirmed at admission  Family Communication: Husband at bedside Disposition Plan: [place] given good functional status, following improvement in urosepsis Consults called: None  Admission status: Inpatient due to acuity of medical condition in chronically ill patient.     RVance Gather MD [facility] Pager [phone] If 7PM-7AM, please contact night-coverage [email or web address] Password [email or web address] 10/19/2017, 5:10 PM

## 2017-10-19 NOTE — ED Notes (Signed)
ED TO INPATIENT HANDOFF REPORT  Name/Age/Gender Lisa Fisher 56 y.o. female  Code Status Code Status History    Date Active Date Inactive Code Status Order ID Comments User Context   07/26/2017 16:58 07/28/2017 16:59 Full Code [id number]  Vashti Hey, MD ED      Home/SNF/Other Home  Chief Complaint chemo pt/fever and chills  Level of Care/Admitting Diagnosis ED Disposition    ED Disposition Condition [place]: [facility] [[id number]]  Level of Care: Med-Surg [16]  Diagnosis: Sepsis due to urinary tract infection [facility]) [[id number]]  Admitting Physician: Patrecia Pour [phone]  Attending Physician: Patrecia Pour [phone]  Estimated length of stay: past midnight tomorrow  Certification:: I certify this patient will need inpatient services for at least 2 midnights  PT Class (Do Not Modify): Inpatient [101]  PT Acc Code (Do Not Modify): Private [1]       Medical History Past Medical History:  Diagnosis Date  . Allergy   . Anxiety   . Asthma   . Breast cancer ([place]) 1999    left   . Breast cancer metastasized to bone ([place]) 03/2003   stage IV mets to sternum  . Depression   . GERD (gastroesophageal reflux disease)   . History of chemotherapy    hx: adjuvant Adriamycin and cyclphosphamide x4,, tamoxifen 2 y,,zometa ,trastuzumab  . History of radiation therapy    to sternum  . Hot flashes    due to letrozole  . 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 Osteopenia     Allergies Allergies  Allergen Reactions  . Levofloxacin Other (See Comments)    Tendonitis  . Naproxen Rash  . Tape Rash    Adhesive tape allergy,paper tape ok    IV Location/Drains/Wounds Patient Lines/Drains/Airways Status   Active Line/Drains/Airways    Name:   Placement date:   Placement time:   Site:   Days:   Implanted Port 06/03/05 Right    06/03/05    -    -   [id number]          Labs/Imaging Results for orders placed or  performed during the hospital encounter of 10/19/17 (from the past 48 hour(s))  Comprehensive metabolic panel     Status: Abnormal   Collection Time: 10/19/17  1:25 PM  Result Value Ref Range   Sodium 127 (L) 135 - 145 mmol/L   Potassium 4.1 3.5 - 5.1 mmol/L   Chloride 89 (L) 101 - 111 mmol/L   CO2 26 22 - 32 mmol/L   Glucose, Bld 129 (H) 65 - 99 mg/dL   BUN 11 6 - 20 mg/dL   Creatinine, Ser 0.55 0.44 - 1.00 mg/dL   Calcium 8.7 (L) 8.9 - 10.3 mg/dL   Total Protein 6.9 6.5 - 8.1 g/dL   Albumin 3.4 (L) 3.5 - 5.0 g/dL   AST 34 15 - 41 U/L   ALT 15 14 - 54 U/L   Alkaline Phosphatase 127 (H) 38 - 126 U/L   Total Bilirubin 0.3 0.3 - 1.2 mg/dL   GFR calc non Af Amer >60 >60 mL/min   GFR calc Af Amer >60 >60 mL/min    Comment: (NOTE) The eGFR has been calculated using the CKD EPI equation. This calculation has not been validated in all clinical situations. eGFR's persistently <60 mL/min signify possible Chronic Kidney Disease.    Anion gap 12 5 - 15  CBC with Differential  Status: Abnormal   Collection Time: 10/19/17  1:25 PM  Result Value Ref Range   WBC 6.4 4.0 - 10.5 K/uL   RBC 3.33 (L) 3.87 - 5.11 MIL/uL   Hemoglobin 10.1 (L) 12.0 - 15.0 g/dL   HCT 29.7 (L) 36.0 - 46.0 %   MCV 89.2 78.0 - 100.0 fL   MCH 30.3 26.0 - 34.0 pg   MCHC 34.0 30.0 - 36.0 g/dL   RDW 15.1 11.5 - 15.5 %   Platelets 291 150 - 400 K/uL   Neutrophils Relative % 50 %   Lymphocytes Relative 11 %   Monocytes Relative 39 %   Eosinophils Relative 0 %   Basophils Relative 0 %   Neutro Abs 3.2 1.7 - 7.7 K/uL   Lymphs Abs 0.7 0.7 - 4.0 K/uL   Monocytes Absolute 2.5 (H) 0.1 - 1.0 K/uL   Eosinophils Absolute 0.0 0.0 - 0.7 K/uL   Basophils Absolute 0.0 0.0 - 0.1 K/uL   RBC Morphology POLYCHROMASIA PRESENT    WBC Morphology DOHLE BODIES     Comment: MILD LEFT SHIFT (1-5% METAS, OCC MYELO, OCC BANDS)  Protime-INR     Status: None   Collection Time: 10/19/17  1:25 PM  Result Value Ref Range   Prothrombin  Time 13.4 11.4 - 15.2 seconds   INR 1.03   I-Stat CG4 Lactic Acid, ED     Status: Abnormal   Collection Time: 10/19/17  2:35 PM  Result Value Ref Range   Lactic Acid, Venous 3.14 (HH) 0.5 - 1.9 mmol/L   Comment NOTIFIED PHYSICIAN   Urinalysis, Routine w reflex microscopic     Status: Abnormal   Collection Time: 10/19/17  3:21 PM  Result Value Ref Range   Color, Urine YELLOW YELLOW   APPearance HAZY (A) CLEAR   Specific Gravity, Urine 1.011 1.005 - 1.030   pH 5.0 5.0 - 8.0   Glucose, UA NEGATIVE NEGATIVE mg/dL   Hgb urine dipstick SMALL (A) NEGATIVE   Bilirubin Urine NEGATIVE NEGATIVE   Ketones, ur NEGATIVE NEGATIVE mg/dL   Protein, ur 30 (A) NEGATIVE mg/dL   Nitrite POSITIVE (A) NEGATIVE   Leukocytes, UA LARGE (A) NEGATIVE   RBC / HPF 0-5 0 - 5 RBC/hpf   WBC, UA TOO NUMEROUS TO COUNT 0 - 5 WBC/hpf   Bacteria, UA FEW (A) NONE SEEN   Squamous Epithelial / LPF 0-5 (A) NONE SEEN   WBC Clumps PRESENT    Mucus PRESENT    Hyaline Casts, UA PRESENT   I-Stat CG4 Lactic Acid, ED     Status: Abnormal   Collection Time: 10/19/17  4:55 PM  Result Value Ref Range   Lactic Acid, Venous 2.04 (HH) 0.5 - 1.9 mmol/L   Comment NOTIFIED PHYSICIAN    Dg Chest 2 View  Result Date: 10/19/2017 CLINICAL DATA:  Fever EXAM: CHEST  2 VIEW COMPARISON:  July 26, 2017 FINDINGS: There are probable nipple shadows on each side. In addition, there is a 6 mm nodular opacity in the right lower lobe. There is no edema or consolidation. Heart size and pulmonary vascularity are normal. No adenopathy. Central catheter tip is in the superior vena cava. No pneumothorax. There is midthoracic dextroscoliosis with thoracolumbar levoscoliosis. There are surgical clips in left axilla. IMPRESSION: 6 mm nodular opacity right lower lobe. This finding was not present on prior study. This finding warrants noncontrast enhanced chest CT to further assess. There are in addition probable nipple shadows bilaterally. No edema or  consolidation.  No evident adenopathy. Central catheter tip in superior vena cava. No evident pneumothorax. Electronically Signed   By: Lowella Grip III M.D.   On: 10/19/2017 15:54    Pending Labs Unresulted Labs (From admission, onward)   Start     Ordered   10/19/17 1440  Urine culture  STAT,   STAT     10/19/17 1440   10/19/17 1410  Culture, blood (Routine x 2)  BLOOD CULTURE X 2,   STAT     10/19/17 1410   Signed and Held  HIV antibody (Routine Testing)  Tomorrow morning,   R     Signed and Held   Signed and Held  Creatinine, serum  (enoxaparin (LOVENOX)    CrCl >/= 30 ml/min)  Weekly,   R    Comments:  while on enoxaparin therapy    Signed and Held   Signed and Held  Basic metabolic panel  Tomorrow morning,   R     Signed and Held   Signed and Held  CBC  Tomorrow morning,   R     Signed and Held   Signed and Held  Lactic acid, plasma  Once,   R     Signed and Held      Vitals/Pain Today's Vitals   10/19/17 1408 10/19/17 1531 10/19/17 1551 10/19/17 1600  BP:  105/62 (!) 113/55 (!) 105/56  Pulse:  94 92 (!) 101  Resp:  16 (!) 21 15  Temp:  98.2 F (36.8 C)    TempSrc:  Oral    SpO2:  100% 99% 98%  Weight:      Height:      PainSc: 4        Isolation Precautions No active isolations  Medications Medications  oxyCODONE (Oxy IR/ROXICODONE) immediate release tablet 10 mg (not administered)  piperacillin-tazobactam (ZOSYN) IVPB 3.375 g (0 g Intravenous Stopped 10/19/17 1600)  vancomycin (VANCOCIN) IVPB 1000 mg/200 mL premix (0 mg Intravenous Stopped 10/19/17 1640)  sodium chloride 0.9 % bolus 1,000 mL (0 mLs Intravenous Stopped 10/19/17 1646)    Mobility walks

## 2017-10-19 NOTE — ED Notes (Addendum)
BLOOD CULTURE 5ML EACH OBTAINED FROM PORT /2ND SET

## 2017-10-19 NOTE — ED Notes (Signed)
Pt is requesting all labs be drawn from port.

## 2017-10-19 NOTE — ED Notes (Addendum)
UNABLE TO REACH ADMISSION MD DAY TIME AFTER 3 ATTEMPTS. NOW PAGING NIGHT TIME MD. Santiago Glad UNIT SECRETARY HAS MADE ALL PAGES. FAMILY AND PT MADE OF THIS WRITER'S ATTEMPTS AND PLAN OF CARE.

## 2017-10-19 NOTE — ED Triage Notes (Signed)
Pt reports she began to have a fever this morning. Pt had temperature between 100.67F and 102F at home. Took tylenol at home at 1200. Pt is on chemo for breast cancer. Last treatment a week and a half ago.

## 2017-10-19 NOTE — Progress Notes (Signed)
Pharmacy Antibiotic Note  Lisa Fisher is a 56 y.o. female on palliative chemo at [facility] for metastatic breast cancer (last rec'd herceptin, abraxane, perjeta on 11/6), admitted on 10/19/2017 with urosepsis. Pharmacy has been consulted for Cefepime dosing. [place] WNL  Plan:  Cefepime 1g IV q12 hr based on source and lower weight  Height: 5\' 6"  (167.6 cm) Weight: 132 lb (59.9 kg) IBW/kg (Calculated) : 59.3  Temp (24hrs), Avg:99.9 F (37.7 C), Min:98.2 F (36.8 C), Max:103 F (39.4 C)  Recent Labs  Lab 10/19/17 1325 10/19/17 1435 10/19/17 1655  WBC 6.4  --   --   CREATININE 0.55  --   --   LATICACIDVEN  --  3.14* 2.04*    Estimated Creatinine Clearance: 73.5 mL/min (by C-G formula based on SCr of 0.55 mg/dL).    Allergies  Allergen Reactions  . Levofloxacin Other (See Comments)    Tendonitis  . Naproxen Rash  . Tape Rash    Adhesive tape allergy,paper tape ok    Thank you for allowing pharmacy to be a part of this patient's care.  Reuel Boom, PharmD, BCPS Pager: [phone] 10/19/2017, 8:58 PM

## 2017-10-19 NOTE — ED Notes (Signed)
Pt request I STAT to be drawn from her port.

## 2017-10-19 NOTE — ED Notes (Signed)
ADMISSION MD PRESENT. AWARE OF 2ND BOLUS INFUSION AND RATE INFUSING PER E-LINK RECOMMENDATIONS. PT REQUESTING PAIN MEDICATIONS

## 2017-10-19 NOTE — ED Notes (Signed)
STEP- DOWN BEDS AVAILABLE- CHRISTIAN CHARGE RN AWARE PT IS COMING. UPDATING ON PT'S CURRENT STATUS.

## 2017-10-19 NOTE — ED Notes (Signed)
TEMP 102 ORALLY

## 2017-10-19 NOTE — Progress Notes (Signed)
A consult was received from an ED physician for Vancomycin and Zosyn per pharmacy dosing.  The patient's profile has been reviewed for ht/wt/allergies/indication/available labs. A one time order has been placed for the above antibiotics.  Further antibiotics/pharmacy consults should be ordered by admitting physician if indicated.                       Thank you, Reuel Boom, PharmD, BCPS Pager: [phone] 10/19/2017, 3:14 PM

## 2017-10-20 ENCOUNTER — Other Ambulatory Visit: Payer: Self-pay

## 2017-10-20 DIAGNOSIS — N390 Urinary tract infection, site not specified: Secondary | ICD-10-CM

## 2017-10-20 DIAGNOSIS — D649 Anemia, unspecified: Secondary | ICD-10-CM

## 2017-10-20 DIAGNOSIS — E876 Hypokalemia: Secondary | ICD-10-CM

## 2017-10-20 DIAGNOSIS — A419 Sepsis, unspecified organism: Secondary | ICD-10-CM

## 2017-10-20 DIAGNOSIS — E871 Hypo-osmolality and hyponatremia: Secondary | ICD-10-CM

## 2017-10-20 DIAGNOSIS — C7951 Secondary malignant neoplasm of bone: Secondary | ICD-10-CM

## 2017-10-20 DIAGNOSIS — C50919 Malignant neoplasm of unspecified site of unspecified female breast: Secondary | ICD-10-CM

## 2017-10-20 LAB — BASIC METABOLIC PANEL
Anion gap: 8 (ref 5–15)
BUN: 9 mg/dL (ref 6–20)
CALCIUM: 7.4 mg/dL — AB (ref 8.9–10.3)
CO2: 24 mmol/L (ref 22–32)
Chloride: 99 mmol/L — ABNORMAL LOW (ref 101–111)
Creatinine, Ser: 0.52 mg/dL (ref 0.44–1.00)
GFR calc Af Amer: >60 mL/min (ref >60)
Glucose, Bld: 138 mg/dL — ABNORMAL HIGH (ref 65–99)
POTASSIUM: 3.2 mmol/L — AB (ref 3.5–5.1)
SODIUM: 131 mmol/L — AB (ref 135–145)

## 2017-10-20 LAB — HIV ANTIBODY (ROUTINE TESTING W REFLEX): HIV SCREEN 4TH GENERATION: NONREACTIVE

## 2017-10-20 LAB — ALBUMIN: Albumin: 2.4 g/dL — ABNORMAL LOW (ref 3.5–5.0)

## 2017-10-20 LAB — CBC
HEMATOCRIT: 24.2 % — AB (ref 36.0–46.0)
Hemoglobin: 8.4 g/dL — ABNORMAL LOW (ref 12.0–15.0)
MCH: 31 pg (ref 26.0–34.0)
MCHC: 34.7 g/dL (ref 30.0–36.0)
MCV: 89.3 fL (ref 78.0–100.0)
PLATELETS: 205 10*3/uL (ref 150–400)
RBC: 2.71 MIL/uL — ABNORMAL LOW (ref 3.87–5.11)
RDW: 15.2 % (ref 11.5–15.5)
WBC: 5 10*3/uL (ref 4.0–10.5)

## 2017-10-20 LAB — MAGNESIUM: Magnesium: 1.3 mg/dL — ABNORMAL LOW (ref 1.7–2.4)

## 2017-10-20 MED ORDER — SODIUM CHLORIDE 0.9% FLUSH
10.0000 mL | INTRAVENOUS | Status: DC | PRN
  Administered 2017-10-21: 10 mL
  Administered 2017-10-21: 30 mL
  Filled 2017-10-20 (×2): qty 40

## 2017-10-20 MED ORDER — MAGNESIUM SULFATE 50 % IJ SOLN
6.0000 g | Freq: Once | INTRAVENOUS | Status: AC
  Administered 2017-10-20: 6 g via INTRAVENOUS
  Filled 2017-10-20: qty 10

## 2017-10-20 MED ORDER — POTASSIUM CHLORIDE CRYS ER 20 MEQ PO TBCR
40.0000 meq | EXTENDED_RELEASE_TABLET | ORAL | Status: AC
  Administered 2017-10-20 (×2): 40 meq via ORAL
  Filled 2017-10-20 (×2): qty 2

## 2017-10-20 MED ORDER — SODIUM CHLORIDE 0.9 % IV SOLN
INTRAVENOUS | Status: DC
  Administered 2017-10-20 – 2017-10-21 (×2): via INTRAVENOUS

## 2017-10-20 MED ORDER — CHLORHEXIDINE GLUCONATE CLOTH 2 % EX PADS
6.0000 | MEDICATED_PAD | Freq: Every day | CUTANEOUS | Status: DC
  Administered 2017-10-20: 6 via TOPICAL

## 2017-10-20 NOTE — Progress Notes (Signed)
PROGRESS NOTE    Young Brim  UXN:[id number] DOB: 08-Nov-1961 DOA: 10/19/2017 PCP: Carol Ada, MD    Brief Narrative:  Lisa Fisher is a 56 y.o. female with a history of metastatic breast cancer on palliative chemotherapy, HTN, depression, and chronic pain who presented to the ED for fever and chills that began this morning. Upon waking she felt chills constantly for an hour that got worse, checked her oral temperature and found it to be 102F. She took a tylenol which helped and called her oncologist at [facility] who referred her to the nearest ED. On arrival she also reported a few days of urinary frequency and urgency and discomfort toward the end of urination, but denied cough, dyspnea, or sick contacts. Last chemo was 11/6, done every 3 weeks, after which she's had low level low back soreness improved with her stable oxycontin/oxycodone regimen. She was tachycardic, no longer febrile, with WBC 6.4 (ANC 3.2), lactic acid 3.14, Na 127, with normal creatinine and a clean catch urinalysis showing TNTC WBCs in clumps and nitrite positive bacteriuria. Vancomycin, zosyn, and 30cc/kg IV fluids were administered after blood cultures were drawn, urine culture sent, and hospitalists called for admission.   Assessment & Plan:   Principal Problem:   Sepsis due to urinary tract infection ([place]) Active Problems:   Breast cancer metastasized to bone (HCC)   Benign essential HTN   GERD (gastroesophageal reflux disease)   Depression   Hyponatremia   Sepsis due to UTI: With tachypnea, tachycardia, and relative hypotension. Also has lactate elevation and symptomatic pyuria with nitrites. Received vanc/zosyn and had severe tendonitis as a result of fluoroquinolones in the past. [ ]  f/u urine cx and blood cx - Change to cefepime monotherapy pending urine culture results - s/p 30cc/kg IV fluids and lactate improved. Pt appears hemodynamically stabilized, transfer to floor on tele  - Tylenol prn fever.    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 Harden Mo.  Not neutropenic. - Continue home pain regimen (oxycontin + oxyIR prn breakthrough pain) for malignancy-associated pain. Continue bowel regimen. Zofran prn nausea/vomiting.  - Alkaline phosphatase elevation thought to be due to known bony metastases.   68mm RLL pulmonary nodule: Incidentally noted on CXR. Discussed with patient who prefers to "not know" what this is at this time. Would like to forego recommended chest CT at this time. Since there are no alternative indications for this study, we will defer this to her outpatient oncologist, who was planning on rescanning next month anyway.   HTN: Chronic, stable. - holding antihypertensives in setting of sepsis   Depression: Chronic, stable.  - Continue celebrex in AM.   GERD: Chronic, stable. - Continue PPI  Anemia: decreased H/H, likely 2/2 dilution.  Continue to monitor with daily CBC.  Hyponatremia: Presumed hypovolemic in setting of sepsis.  Improving. - continue IVF  Hypokalemia  Hypomagnesemia: replete prn  DVT prophylaxis: lovenox Code Status:full  Family Communication: husband at bedside Disposition Plan: pending improvement   Consultants:   none  Procedures: (Don't include imaging studies which can be auto populated. Include things that cannot be auto populated i.e. Echo, Carotid and venous dopplers, Foley, Bipap, HD, tubes/drains, wound vac, central lines etc)  none  Antimicrobials: (specify start and planned stop date. Auto populated tables are space occupying and do not give end dates) Anti-infectives (From admission, onward)   Start     Dose/Rate Route Frequency Ordered Stop   10/19/17 2200  ceFEPIme (MAXIPIME)  1 g in dextrose 5 % 50 mL IVPB     1 g 100 mL/hr over 30 Minutes Intravenous Every 12 hours 10/19/17 2053     10/19/17 1445  piperacillin-tazobactam (ZOSYN) IVPB 3.375 g      3.375 g 100 mL/hr over 30 Minutes Intravenous  Once 10/19/17 1440 10/19/17 1600   10/19/17 1445  vancomycin (VANCOCIN) IVPB 1000 mg/200 mL premix     1,000 mg 200 mL/hr over 60 Minutes Intravenous  Once 10/19/17 1440 10/19/17 1640         Subjective: Had some sweats this morning.  Feels tired.  Overall feeling better.  Would like to go home today if possible.  Objective: Vitals:   10/20/17 0800 10/20/17 0900 10/20/17 1000 10/20/17 1046  BP: (!) 130/59 (!) 145/71 (!) 158/78 (!) 144/57  Pulse: 87 90 (!) 42 97  Resp: 15 10 16 18   Temp: (!) 97.1 F (36.2 C)   (!) 100.4 F (38 C)  TempSrc: Oral   Oral  SpO2: 100% 97% 94% 95%  Weight:      Height:        Intake/Output Summary (Last 24 hours) at 10/20/2017 1245 Last data filed at 10/20/2017 1014 Gross per 24 hour  Intake 5254.58 ml  Output 2 ml  Net 5252.58 ml   Filed Weights   10/19/17 1403 10/19/17 2112  Weight: 59.9 kg (132 lb) 61.3 kg (135 lb 2.3 oz)    Examination:  General exam: Appears calm and comfortable  Respiratory system: Clear to auscultation. Respiratory effort normal. Cardiovascular system: S1 & S2 heard, RRR. No JVD, murmurs, rubs, gallops or clicks. No pedal edema. Gastrointestinal system: Abdomen is nondistended, soft and nontender. No organomegaly or masses felt. Normal bowel sounds heard. Central nervous system: Alert and oriented. No focal neurological deficits. Extremities: Symmetric 5 x 5 power. Skin: No rashes, lesions or ulcers Psychiatry: Judgement and insight appear normal. Mood & affect appropriate.     Data Reviewed: I have personally reviewed following labs and imaging studies  CBC: Recent Labs  Lab 10/19/17 1325 10/20/17 0343  WBC 6.4 5.0  NEUTROABS 3.2  --   HGB 10.1* 8.4*  HCT 29.7* 24.2*  MCV 89.2 89.3  PLT 291 419   Basic Metabolic Panel: Recent Labs  Lab 10/19/17 1325 10/20/17 0343  NA 127* 131*  K 4.1 3.2*  CL 89* 99*  CO2 26 24  GLUCOSE 129* 138*  BUN 11 9   CREATININE 0.55 0.52  CALCIUM 8.7* 7.4*  MG  --  1.3*   GFR: Estimated Creatinine Clearance: 73.5 mL/min (by C-G formula based on SCr of 0.52 mg/dL). Liver Function Tests: Recent Labs  Lab 10/19/17 1325 10/20/17 0343  AST 34  --   ALT 15  --   ALKPHOS 127*  --   BILITOT 0.3  --   PROT 6.9  --   ALBUMIN 3.4* 2.4*   No results for input(s): LIPASE, AMYLASE in the last 168 hours. No results for input(s): AMMONIA in the last 168 hours. Coagulation Profile: Recent Labs  Lab 10/19/17 1325  INR 1.03   Cardiac Enzymes: No results for input(s): CKTOTAL, CKMB, CKMBINDEX, TROPONINI in the last 168 hours. BNP (last 3 results) No results for input(s): PROBNP in the last 8760 hours. HbA1C: No results for input(s): HGBA1C in the last 72 hours. CBG: No results for input(s): GLUCAP in the last 168 hours. Lipid Profile: No results for input(s): CHOL, HDL, LDLCALC, TRIG, CHOLHDL, LDLDIRECT in the last 72  hours. Thyroid Function Tests: No results for input(s): TSH, T4TOTAL, FREET4, T3FREE, THYROIDAB in the last 72 hours. Anemia Panel: No results for input(s): VITAMINB12, FOLATE, FERRITIN, TIBC, IRON, RETICCTPCT in the last 72 hours. Sepsis Labs: Recent Labs  Lab 10/19/17 1435 10/19/17 1655 10/19/17 2122  LATICACIDVEN 3.14* 2.04* 1.2    Recent Results (from the past 240 hour(s))  MRSA PCR Screening     Status: None   Collection Time: 10/19/17  8:59 PM  Result Value Ref Range Status   MRSA by PCR NEGATIVE NEGATIVE Final    Comment:        The GeneXpert MRSA Assay (FDA approved for NASAL specimens only), is one component of a comprehensive MRSA colonization surveillance program. It is not intended to diagnose MRSA infection nor to guide or monitor treatment for MRSA infections.          Radiology Studies: Dg Chest 2 View  Result Date: 10/19/2017 CLINICAL DATA:  Fever EXAM: CHEST  2 VIEW COMPARISON:  July 26, 2017 FINDINGS: There are probable nipple shadows on  each side. In addition, there is a 6 mm nodular opacity in the right lower lobe. There is no edema or consolidation. Heart size and pulmonary vascularity are normal. No adenopathy. Central catheter tip is in the superior vena cava. No pneumothorax. There is midthoracic dextroscoliosis with thoracolumbar levoscoliosis. There are surgical clips in left axilla. IMPRESSION: 6 mm nodular opacity right lower lobe. This finding was not present on prior study. This finding warrants noncontrast enhanced chest CT to further assess. There are in addition probable nipple shadows bilaterally. No edema or consolidation. No evident adenopathy. Central catheter tip in superior vena cava. No evident pneumothorax. Electronically Signed   By: Lowella Grip III M.D.   On: 10/19/2017 15:54        Scheduled Meds: . Chlorhexidine Gluconate Cloth  6 each Topical Daily  . docusate sodium  100 mg Oral BID  . DULoxetine  30 mg Oral Daily  . enoxaparin (LOVENOX) injection  40 mg Subcutaneous Q24H  . magnesium oxide  400 mg Oral Daily  . oxyCODONE  20 mg Oral Q12H  . pantoprazole  40 mg Oral Daily  . potassium chloride  40 mEq Oral Q4H  . senna  1 tablet Oral Daily   Continuous Infusions: . sodium chloride 125 mL/hr at 10/20/17 0601  . ceFEPime (MAXIPIME) IV Stopped (10/20/17 1014)     LOS: 1 day    Time spent: over 30 minutes    Fayrene Helper, MD [facility] Pager [phone]  If 7PM-7AM, please contact night-coverage [email or web address] Password [email or web address] 10/20/2017, 12:45 PM

## 2017-10-20 NOTE — Progress Notes (Signed)
Assumed care of pt @ 1330.  I agree with previous RN's assessment.  Will continue with plan of care.

## 2017-10-21 DIAGNOSIS — F329 Major depressive disorder, single episode, unspecified: Secondary | ICD-10-CM

## 2017-10-21 LAB — BLOOD CULTURE ID PANEL (REFLEXED)
Acinetobacter baumannii: NOT DETECTED
CANDIDA GLABRATA: NOT DETECTED
CANDIDA KRUSEI: NOT DETECTED
CANDIDA PARAPSILOSIS: NOT DETECTED
CANDIDA TROPICALIS: NOT DETECTED
CARBAPENEM RESISTANCE: NOT DETECTED
Candida albicans: NOT DETECTED
ENTEROCOCCUS SPECIES: NOT DETECTED
ESCHERICHIA COLI: DETECTED — AB
Enterobacter cloacae complex: NOT DETECTED
Enterobacteriaceae species: DETECTED — AB
Haemophilus influenzae: NOT DETECTED
KLEBSIELLA OXYTOCA: NOT DETECTED
KLEBSIELLA PNEUMONIAE: NOT DETECTED
LISTERIA MONOCYTOGENES: NOT DETECTED
Neisseria meningitidis: NOT DETECTED
PROTEUS SPECIES: NOT DETECTED
Pseudomonas aeruginosa: NOT DETECTED
SERRATIA MARCESCENS: NOT DETECTED
STAPHYLOCOCCUS AUREUS BCID: NOT DETECTED
STAPHYLOCOCCUS SPECIES: NOT DETECTED
Streptococcus agalactiae: NOT DETECTED
Streptococcus pneumoniae: NOT DETECTED
Streptococcus pyogenes: NOT DETECTED
Streptococcus species: NOT DETECTED

## 2017-10-21 LAB — CBC WITH DIFFERENTIAL/PLATELET
BASOS ABS: 0 10*3/uL (ref 0.0–0.1)
BASOS PCT: 0 %
EOS ABS: 0.1 10*3/uL (ref 0.0–0.7)
EOS PCT: 1 %
HCT: 22.4 % — ABNORMAL LOW (ref 36.0–46.0)
Hemoglobin: 7.7 g/dL — ABNORMAL LOW (ref 12.0–15.0)
Lymphocytes Relative: 14 %
Lymphs Abs: 0.8 10*3/uL (ref 0.7–4.0)
MCH: 30.9 pg (ref 26.0–34.0)
MCHC: 34.4 g/dL (ref 30.0–36.0)
MCV: 90 fL (ref 78.0–100.0)
Monocytes Absolute: 1.2 10*3/uL — ABNORMAL HIGH (ref 0.1–1.0)
Monocytes Relative: 20 %
Neutro Abs: 4.1 10*3/uL (ref 1.7–7.7)
Neutrophils Relative %: 65 %
PLATELETS: 214 10*3/uL (ref 150–400)
RBC: 2.49 MIL/uL — AB (ref 3.87–5.11)
RDW: 15.4 % (ref 11.5–15.5)
WBC: 6.2 10*3/uL (ref 4.0–10.5)

## 2017-10-21 LAB — BASIC METABOLIC PANEL
ANION GAP: 6 (ref 5–15)
CALCIUM: 7.1 mg/dL — AB (ref 8.9–10.3)
CO2: 23 mmol/L (ref 22–32)
Chloride: 104 mmol/L (ref 101–111)
Creatinine, Ser: 0.33 mg/dL — ABNORMAL LOW (ref 0.44–1.00)
GFR calc Af Amer: >60 mL/min (ref >60)
Glucose, Bld: 98 mg/dL (ref 65–99)
POTASSIUM: 3.6 mmol/L (ref 3.5–5.1)
SODIUM: 133 mmol/L — AB (ref 135–145)

## 2017-10-21 LAB — HEMOGLOBIN AND HEMATOCRIT, BLOOD
HEMATOCRIT: 23.3 % — AB (ref 36.0–46.0)
Hemoglobin: 8 g/dL — ABNORMAL LOW (ref 12.0–15.0)

## 2017-10-21 LAB — URINE CULTURE: Culture: >=100000 — AB

## 2017-10-21 LAB — MAGNESIUM: MAGNESIUM: 2.4 mg/dL (ref 1.7–2.4)

## 2017-10-21 LAB — ALBUMIN: ALBUMIN: 2.2 g/dL — AB (ref 3.5–5.0)

## 2017-10-21 MED ORDER — HEPARIN SOD (PORK) LOCK FLUSH 100 UNIT/ML IV SOLN
500.0000 [IU] | INTRAVENOUS | Status: AC | PRN
  Administered 2017-10-21: 500 [IU]

## 2017-10-21 MED ORDER — BOOST / RESOURCE BREEZE PO LIQD
1.0000 | Freq: Three times a day (TID) | ORAL | Status: DC
  Administered 2017-10-21: 1 via ORAL

## 2017-10-21 MED ORDER — BOOST / RESOURCE BREEZE PO LIQD: 1.0000 | Freq: Three times a day (TID) | ORAL | 0 refills | Status: AC

## 2017-10-21 MED ORDER — CEPHALEXIN 500 MG PO CAPS
500.0000 mg | ORAL_CAPSULE | Freq: Four times a day (QID) | ORAL | 0 refills | Status: AC
Start: 2017-10-21 — End: 2017-10-28

## 2017-10-21 MED ORDER — CEPHALEXIN 500 MG PO CAPS
500.0000 mg | ORAL_CAPSULE | Freq: Four times a day (QID) | ORAL | Status: DC
  Administered 2017-10-21: 500 mg via ORAL
  Filled 2017-10-21: qty 1

## 2017-10-21 MED ORDER — ALTEPLASE 2 MG IJ SOLR
2.0000 mg | Freq: Once | INTRAMUSCULAR | Status: AC
  Administered 2017-10-21: 2 mg
  Filled 2017-10-21: qty 2

## 2017-10-21 NOTE — Discharge Summary (Addendum)
Physician Discharge Summary  Lisa Fisher OIN:[id number] DOB: 03/17/1961 DOA: 10/19/2017  PCP: Carol Ada, MD  Admit date: 10/19/2017 Discharge date: 10/21/2017  Time spent: over 30 minutes  Recommendations for Outpatient Follow-up:  1. Follow up outpatient CBC/CMP (H/H was stable on day of discharge, but had been drifting down) 2. Follow up final blood cultures 3. Follow up with PCP and oncology  4. Follow up lung nodule  Discharge Diagnoses:  Principal Problem:   Sepsis due to urinary tract infection ([place]) Active Problems:   Breast cancer metastasized to bone (HCC)   Benign essential HTN   GERD (gastroesophageal reflux disease)   Depression   Hyponatremia   Discharge Condition: stable  Diet recommendation: heart healthy  Filed Weights   10/19/17 1403 10/19/17 2112  Weight: 59.9 kg (132 lb) 61.3 kg (135 lb 2.3 oz)    History of present illness:  Lisa Fisher a 56 y.o.femalewitha history of metastatic breast cancer on palliative chemotherapy, HTN, depression, and chronic pain who presented to the ED for fever and chills that began this morning. Upon waking she felt chills constantly for an hour that got worse, checked her oral temperature and found it to be 102F. She took a tylenol which helped and called her oncologist at [facility] who referred her to the nearest ED. On arrival she also reported a few days of urinary frequency and urgency and discomfort toward the end of urination, but denied cough, dyspnea, or sick contacts. Last chemo was 11/6, done every 3 weeks, after which she's had low level low back soreness improved with her stable oxycontin/oxycodone regimen. She was tachycardic, no longer febrile, with WBC 6.4 (ANC 3.2), lactic acid 3.14, Na 127, with normal creatinine and a clean catch urinalysis showing TNTC WBCs in clumps and nitrite positive bacteriuria. Vancomycin, zosyn, and 30cc/kg IV fluids were administered after blood cultures were drawn, urine culture  sent, and hospitalists called for admission.  Hospital Course:  Sepsis due to e. coli UTI: With tachypnea, tachycardia, and relative hypotension. Also has lactate elevation and symptomatic pyuria with nitrites. Received vanc/zosyn and had severe tendonitis as a result of fluoroquinolones in the past. Symptoms have improved on IV antibiotics. Had fever last night 11/17, but so far blood cultures negative and symptoms overall improving.  [ ]  ucx with pansensitive e. Coli [ ]  bcx NGTD - cefepime 11/16-11/18, discharged on keflex PO - s/p 30cc/kg IV fluids and lactate improved.   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 Harden Mo.  Not neutropenic. - Continue home pain regimen (oxycontin + oxyIR prn breakthrough pain) for malignancy-associated pain. Continue bowel regimen. Zofran prn nausea/vomiting.  - Alkaline phosphatase elevation thought to be due to known bony metastases.   21mm RLL pulmonary nodule: Incidentally noted on CXR. Discussed with patient who prefers to "not know" what this is at this time. Would like to forego recommended chest CT at this time. Discussed recommendation to follow up with PCP or oncology as outpatient.   HTN: Chronic, stable. - holding antihypertensives in setting of sepsis   Depression: Chronic, stable.  - Continue celebrex    GERD: Chronic, stable. - Continue PPI  Anemia: decreased H/H, suspect component of dilution.  Stable on day of discharge, though slightly decreased from day prior.  Continue to monitor outpatient.  No blood in stool, LH, dizziness.    Hyponatremia: improved with IVF  Hypokalemia  Hypomagnesemia: replete prn  Procedures:  none (i.e. Studies not automatically included, echos,  thoracentesis, etc; not x-rays)  Consultations:  none  Discharge Exam: Vitals:   10/21/17 0553 10/21/17 1500  BP: 126/62 115/64  Pulse: 91 90  Resp: 20 18  Temp: 98.3 F  (36.8 C) 98.2 F (36.8 C)  SpO2: 98% 98%   Feels better.  Urinary frequency is less.  Never had buring, but tingling is less.  Would be happy to go home today.  General: No acute distress. Cardiovascular: Heart sounds show a regular rate, and rhythm. No gallops or rubs. No murmurs. No JVD. Lungs: Clear to auscultation bilaterally with good air movement. No rales, rhonchi or wheezes. Abdomen: Soft, nontender, nondistended with normal active bowel sounds. No masses. No hepatosplenomegaly. Neurological: Alert and oriented 3. Moves all extremities 4 with equal strength. Cranial nerves II through XII grossly intact. Skin: Warm and dry. No rashes or lesions. Extremities: No clubbing or cyanosis. No edema.  Psychiatric: Mood and affect are normal. Insight and judgment are appropriate.  Discharge Instructions   Discharge Instructions    Call MD for:  persistant dizziness or light-headedness   Complete by:  As directed    Call MD for:  persistant nausea and vomiting   Complete by:  As directed    Call MD for:  severe uncontrolled pain   Complete by:  As directed    Call MD for:  temperature >100.4   Complete by:  As directed    Diet - low sodium heart healthy   Complete by:  As directed    Discharge instructions   Complete by:  As directed    You were seen for a UTI (urinary tract infection).  You ended up growing e. Coli in your urine.  This is sensitive to the antibiotic we are sending you home on (keflex).  Take this 4 times a day for the next 7 days.  Please follow up with your PCP within then next few days for repeat labs.  Follow up with your oncologist as well.  Return with new, worsening, or recurrent symptoms.  Your blood counts were a little lower on the day of discharge, but this was stable on repeat, please follow up repeat CBC with your primary in a few days.  You also had the finding on the chest x ray which should be followed up with your primary doctor or your oncologist with  imaging.   Increase activity slowly   Complete by:  As directed      Discharge Medication List as of 10/21/2017  3:49 PM    START taking these medications   Details  cephALEXin (KEFLEX) 500 MG capsule Take 1 capsule (500 mg total) 4 (four) times daily for 7 days by mouth., Starting Sun 10/21/2017, Until Sun 10/28/2017, Normal    feeding supplement (BOOST / RESOURCE BREEZE) LIQD Take 1 Container 3 (three) times daily between meals by mouth., Starting Sun 10/21/2017, Until Tue 11/20/2017, Normal      CONTINUE these medications which have NOT CHANGED   Details  albuterol (PROVENTIL HFA;VENTOLIN HFA) 108 (90 BASE) MCG/ACT inhaler Inhale 2 puffs into the lungs every 4 (four) hours as needed for wheezing., Historical Med    ascorbic acid (VITAMIN C) 500 MG tablet Take 500 mg by mouth daily., Historical Med    calcium-vitamin D (OSCAL WITH D) 500-200 MG-UNIT tablet Take 1 tablet by mouth daily with breakfast., Historical Med    docusate sodium (COLACE) 100 MG capsule Take 100 mg by mouth 2 (two) times daily., Historical Med  DULoxetine (CYMBALTA) 30 MG capsule Take 30 mg by mouth daily., Historical Med    lidocaine-prilocaine (EMLA) cream Apply 1 application as needed topically (port). , Historical Med    losartan (COZAAR) 50 MG tablet Take 50 mg by mouth daily., Historical Med    magnesium oxide (MAG-OX) 400 MG tablet Take 400 mg by mouth daily., Historical Med    !! Oxycodone HCl 10 MG TABS Take 10 mg every 6 (six) hours as needed by mouth (pain). , Historical Med    !! Oxycodone HCl 20 MG TABS Take 20 mg by mouth 2 (two) times daily., Historical Med    PACLitaxel Protein-Bound Part (ABRAXANE IV) Inject into the vein., Historical Med    pantoprazole (PROTONIX) 40 MG tablet Take 40 mg daily by mouth., Starting Tue 10/09/2017, Historical Med    PERTUZUMAB IV Inject into the vein., Historical Med    senna (SENOKOT) 8.6 MG tablet Take 1 tablet at bedtime by mouth. , Historical Med     trastuzumab (HERCEPTIN) 150 MG SOLR injection Inject into the vein., Historical Med     !! - Potential duplicate medications found. Please discuss with provider.    STOP taking these medications     omeprazole (PRILOSEC) 20 MG capsule        Allergies  Allergen Reactions  . Levofloxacin Other (See Comments)    Tendonitis  . Naproxen Rash  . Tape Rash    Adhesive tape allergy,paper tape ok   Follow-up Information    Carol Ada, MD Follow up.   Specialty:  Family Medicine Contact information: [address] [place] [address] [phone]        Darral Dash, MD Follow up.   Specialty:  Internal Medicine Contact information: [facility] [address] [address] [phone]            The results of significant diagnostics from this hospitalization (including imaging, microbiology, ancillary and laboratory) are listed below for reference.    Significant Diagnostic Studies: Dg Chest 2 View  Result Date: 10/19/2017 CLINICAL DATA:  Fever EXAM: CHEST  2 VIEW COMPARISON:  July 26, 2017 FINDINGS: There are probable nipple shadows on each side. In addition, there is a 6 mm nodular opacity in the right lower lobe. There is no edema or consolidation. Heart size and pulmonary vascularity are normal. No adenopathy. Central catheter tip is in the superior vena cava. No pneumothorax. There is midthoracic dextroscoliosis with thoracolumbar levoscoliosis. There are surgical clips in left axilla. IMPRESSION: 6 mm nodular opacity right lower lobe. This finding was not present on prior study. This finding warrants noncontrast enhanced chest CT to further assess. There are in addition probable nipple shadows bilaterally. No edema or consolidation. No evident adenopathy. Central catheter tip in superior vena cava. No evident pneumothorax. Electronically Signed   By: Lowella Grip III M.D.   On: 10/19/2017 15:54     Microbiology: Recent Results (from the past 240 hour(s))  Culture, blood (Routine x 2)     Status: None (Preliminary result)   Collection Time: 10/19/17  3:00 PM  Result Value Ref Range Status   Specimen Description BLOOD PORTA CATH  Final   Special Requests   Final    BOTTLES DRAWN AEROBIC AND ANAEROBIC Blood Culture adequate volume   Culture   Final    NO GROWTH 2 DAYS Performed at [facility], [address] [place], [place] [address]    Report Status PENDING  Incomplete  Urine culture     Status: Abnormal   Collection Time: 10/19/17  3:21 PM  Result Value Ref Range Status   Specimen Description URINE, CLEAN CATCH  Final   Special Requests NONE  Final   Culture >=100,000 COLONIES/mL ESCHERICHIA COLI (A)  Final   Report Status 10/21/2017 FINAL  Final   Organism ID, Bacteria ESCHERICHIA COLI (A)  Final      Susceptibility   Escherichia coli - MIC*    AMPICILLIN <=2 SENSITIVE Sensitive     CEFAZOLIN <=4 SENSITIVE Sensitive     CEFTRIAXONE <=1 SENSITIVE Sensitive     CIPROFLOXACIN <=0.25 SENSITIVE Sensitive     GENTAMICIN <=1 SENSITIVE Sensitive     IMIPENEM <=0.25 SENSITIVE Sensitive     NITROFURANTOIN <=16 SENSITIVE Sensitive     TRIMETH/SULFA <=20 SENSITIVE Sensitive     AMPICILLIN/SULBACTAM <=2 SENSITIVE Sensitive     PIP/TAZO <=4 SENSITIVE Sensitive     Extended ESBL NEGATIVE Sensitive     * >=100,000 COLONIES/mL ESCHERICHIA COLI  MRSA PCR Screening     Status: None   Collection Time: 10/19/17  8:59 PM  Result Value Ref Range Status   MRSA by PCR NEGATIVE NEGATIVE Final    Comment:        The GeneXpert MRSA Assay (FDA approved for NASAL specimens only), is one component of a comprehensive MRSA colonization surveillance program. It is not intended to diagnose MRSA infection nor to guide or monitor treatment for MRSA infections.   Culture, blood (Routine x 2)     Status: None (Preliminary result)   Collection Time: 10/19/17  9:16 PM  Result Value  Ref Range Status   Specimen Description BLOOD RIGHT ANTECUBITAL  Final   Special Requests   Final    BOTTLES DRAWN AEROBIC ONLY Blood Culture adequate volume   Culture   Final    NO GROWTH 1 DAY Performed at [facility], [address], [place], [place] [address]    Report Status PENDING  Incomplete     Labs: Basic Metabolic Panel: Recent Labs  Lab 10/19/17 1325 10/20/17 0343 10/21/17 0644  NA 127* 131* 133*  K 4.1 3.2* 3.6  CL 89* 99* 104  CO2 26 24 23   GLUCOSE 129* 138* 98  BUN 11 9 <5*  CREATININE 0.55 0.52 0.33*  CALCIUM 8.7* 7.4* 7.1*  MG  --  1.3* 2.4   Liver Function Tests: Recent Labs  Lab 10/19/17 1325 10/20/17 0343 10/21/17 0645  AST 34  --   --   ALT 15  --   --   ALKPHOS 127*  --   --   BILITOT 0.3  --   --   PROT 6.9  --   --   ALBUMIN 3.4* 2.4* 2.2*   No results for input(s): LIPASE, AMYLASE in the last 168 hours. No results for input(s): AMMONIA in the last 168 hours. CBC: Recent Labs  Lab 10/19/17 1325 10/20/17 0343 10/21/17 0644 10/21/17 1400  WBC 6.4 5.0 6.2  --   NEUTROABS 3.2  --  4.1  --   HGB 10.1* 8.4* 7.7* 8.0*  HCT 29.7* 24.2* 22.4* 23.3*  MCV 89.2 89.3 90.0  --   PLT 291 205 214  --    Cardiac Enzymes: No results for input(s): CKTOTAL, CKMB, CKMBINDEX, TROPONINI in the last 168 hours. BNP: BNP (last 3 results) No results for input(s): BNP in the last 8760 hours.  ProBNP (last 3 results) No results for input(s):  PROBNP in the last 8760 hours.  CBG: No results for input(s): GLUCAP in the last 168 hours.     Signed:  Fayrene Helper MD.  [facility] 10/21/2017, 6:33 PM

## 2017-10-21 NOTE — Progress Notes (Signed)
Initial Nutrition Assessment  INTERVENTION:   -Provide Boost Breeze po TID, each supplement provides 250 kcal and 9 grams of protein -Encourage PO intake -Reviewed strategies to help with taste changes -RD will continue to monitor  NUTRITION DIAGNOSIS:   Increased nutrient needs related to cancer and cancer related treatments as evidenced by estimated needs.  GOAL:   Patient will meet greater than or equal to 90% of their needs  MONITOR:   PO intake, Supplement acceptance, Labs, Weight trends, I & O's  REASON FOR ASSESSMENT:   Malnutrition Screening Tool    ASSESSMENT:    56 y.o. female with a history of metastatic breast cancer on palliative chemotherapy, HTN, depression, and chronic pain who presented to the ED for fever and chills that began this morning. Upon waking she felt chills constantly for an hour that got worse, checked her oral temperature and found it to be 102F. She took a tylenol which helped and called her oncologist at [facility] who referred her to the nearest ED.   Patient in room with husband at bedside. She is currently trying to consume some liquids such as juice. Pt states juices and fruits are the only foods she can taste at the moment. States that since starting chemotherapy in August, her taste has progressively worsened. Prior to last chemo treatment on 11/6, she was consuming mainly yogurt. Now she can't taste the yogurt she likes. Textures are making eating unenjoyable. Pt had a smoothie yesterday and she likes those. Pt denies trouble swallowing or  N/V PTA. Does state she gets a burning sensation with certain foods. Pt willing to try Boost Breeze supplements, encouraged her to experiment and see if it is better in juice, soda or in fruit smoothies. Encouraged adding yogurt to smoothies to provide extra protein. Pt states she does brush her teeth and rinse her mouth after every meal. She sucks on mints.   Per chart review, pt's weight is stable.    Medications: Colace capsule BID, MAG-OX tablet daily, Protonix tablet daily, Senokot tablet daily Labs reviewed: Low Na Mg WNL   NUTRITION - FOCUSED PHYSICAL EXAM:    Most Recent Value  Orbital Region  No depletion  Upper Arm Region  No depletion  Thoracic and Lumbar Region  Unable to assess  Buccal Region  No depletion  Temple Region  Mild depletion  Clavicle Bone Region  No depletion  Clavicle and Acromion Bone Region  No depletion  Scapular Bone Region  Unable to assess  Dorsal Hand  No depletion  Patellar Region  Unable to assess  Anterior Thigh Region  Unable to assess  Posterior Calf Region  Unable to assess  Edema (RD Assessment)  None       Diet Order:  Diet regular Room service appropriate? Yes; Fluid consistency: Thin  EDUCATION NEEDS:   Education needs have been addressed  Skin:  Skin Assessment: Reviewed RN Assessment  Last BM:  11/17  Height:   Ht Readings from Last 1 Encounters:  10/19/17 5\' 6"  (1.676 m)    Weight:   Wt Readings from Last 1 Encounters:  10/19/17 135 lb 2.3 oz (61.3 kg)    Ideal Body Weight:  59.1 kg  BMI:  Body mass index is 21.81 kg/m.  Estimated Nutritional Needs:   Kcal:  1800-2000  Protein:  85-95g  Fluid:  2L/day  Clayton Bibles, MS, RD, LDN [place] Dietitian Pager: [phone] After Hours Pager: [phone]

## 2017-10-23 LAB — CULTURE, BLOOD (ROUTINE X 2): SPECIAL REQUESTS: ADEQUATE

## 2017-10-24 LAB — CULTURE, BLOOD (ROUTINE X 2)
Culture: NO GROWTH
Special Requests: ADEQUATE

## 2017-10-26 NOTE — Progress Notes (Signed)
Received an in-box message regarding final blood culture results for Lisa Fisher, who I admitted on 11/16. Culture results indicate a positive blood culture for E. coli, fortunately sensitive to the antibiotics with which she was treated. She was discharged with 7 additional days of keflex (after 3 days of IV therapy). I have discussed this with ID on-call, Dr. Johnnye Sima, who believes 14 days would be a more appropriate duration of therapy.   I called the patient to update her on this situation. She is feeling "so much better," and has been taking keflex four times a day faithfully. No N/V/D.   I called keflex 500mg  po QID x4 more days (#16, refill zero) to her preferred pharmacy, [facility] on [address] here in [place]. She will follow up with her oncologist on Tuesday.   Vance Gather, MD 10/26/2017 3:25 PM

## 2017-10-30 DIAGNOSIS — Z853 Personal history of malignant neoplasm of breast: Secondary | ICD-10-CM | POA: Diagnosis not present

## 2017-10-30 DIAGNOSIS — Z171 Estrogen receptor negative status [ER-]: Secondary | ICD-10-CM | POA: Diagnosis not present

## 2017-10-30 DIAGNOSIS — R600 Localized edema: Secondary | ICD-10-CM | POA: Diagnosis not present

## 2017-10-30 DIAGNOSIS — C770 Secondary and unspecified malignant neoplasm of lymph nodes of head, face and neck: Secondary | ICD-10-CM | POA: Diagnosis not present

## 2017-10-30 DIAGNOSIS — R0789 Other chest pain: Secondary | ICD-10-CM | POA: Diagnosis not present

## 2017-10-30 DIAGNOSIS — C7951 Secondary malignant neoplasm of bone: Secondary | ICD-10-CM | POA: Diagnosis not present

## 2017-10-30 DIAGNOSIS — K121 Other forms of stomatitis: Secondary | ICD-10-CM | POA: Diagnosis not present

## 2017-11-13 DIAGNOSIS — R590 Localized enlarged lymph nodes: Secondary | ICD-10-CM | POA: Diagnosis not present

## 2017-11-13 DIAGNOSIS — C7951 Secondary malignant neoplasm of bone: Secondary | ICD-10-CM | POA: Diagnosis not present

## 2017-11-13 DIAGNOSIS — R911 Solitary pulmonary nodule: Secondary | ICD-10-CM | POA: Diagnosis not present

## 2017-11-13 DIAGNOSIS — C801 Malignant (primary) neoplasm, unspecified: Secondary | ICD-10-CM | POA: Diagnosis not present

## 2017-11-13 DIAGNOSIS — Z853 Personal history of malignant neoplasm of breast: Secondary | ICD-10-CM | POA: Diagnosis not present

## 2017-11-15 DIAGNOSIS — C7951 Secondary malignant neoplasm of bone: Secondary | ICD-10-CM | POA: Diagnosis not present

## 2017-11-15 DIAGNOSIS — R0789 Other chest pain: Secondary | ICD-10-CM | POA: Diagnosis not present

## 2017-11-20 DIAGNOSIS — C801 Malignant (primary) neoplasm, unspecified: Secondary | ICD-10-CM | POA: Diagnosis not present

## 2017-11-20 DIAGNOSIS — C7951 Secondary malignant neoplasm of bone: Secondary | ICD-10-CM | POA: Diagnosis not present

## 2017-11-20 DIAGNOSIS — Z5112 Encounter for antineoplastic immunotherapy: Secondary | ICD-10-CM | POA: Diagnosis not present

## 2017-12-11 DIAGNOSIS — R600 Localized edema: Secondary | ICD-10-CM | POA: Diagnosis not present

## 2017-12-11 DIAGNOSIS — G5793 Unspecified mononeuropathy of bilateral lower limbs: Secondary | ICD-10-CM | POA: Diagnosis not present

## 2017-12-11 DIAGNOSIS — L989 Disorder of the skin and subcutaneous tissue, unspecified: Secondary | ICD-10-CM | POA: Diagnosis not present

## 2017-12-11 DIAGNOSIS — Z5112 Encounter for antineoplastic immunotherapy: Secondary | ICD-10-CM | POA: Diagnosis not present

## 2017-12-11 DIAGNOSIS — C50912 Malignant neoplasm of unspecified site of left female breast: Secondary | ICD-10-CM | POA: Diagnosis not present

## 2017-12-11 DIAGNOSIS — R0789 Other chest pain: Secondary | ICD-10-CM | POA: Diagnosis not present

## 2017-12-11 DIAGNOSIS — C7951 Secondary malignant neoplasm of bone: Secondary | ICD-10-CM | POA: Diagnosis not present

## 2017-12-11 DIAGNOSIS — Z170 Estrogen receptor positive status [ER+]: Secondary | ICD-10-CM | POA: Diagnosis not present

## 2017-12-17 DIAGNOSIS — C7951 Secondary malignant neoplasm of bone: Secondary | ICD-10-CM | POA: Diagnosis not present

## 2017-12-26 DIAGNOSIS — G5793 Unspecified mononeuropathy of bilateral lower limbs: Secondary | ICD-10-CM | POA: Diagnosis not present

## 2017-12-27 DIAGNOSIS — J0100 Acute maxillary sinusitis, unspecified: Secondary | ICD-10-CM | POA: Diagnosis not present

## 2017-12-27 DIAGNOSIS — I10 Essential (primary) hypertension: Secondary | ICD-10-CM | POA: Diagnosis not present

## 2017-12-27 DIAGNOSIS — K5903 Drug induced constipation: Secondary | ICD-10-CM | POA: Diagnosis not present

## 2017-12-27 DIAGNOSIS — C50919 Malignant neoplasm of unspecified site of unspecified female breast: Secondary | ICD-10-CM | POA: Diagnosis not present

## 2018-01-01 DIAGNOSIS — C7951 Secondary malignant neoplasm of bone: Secondary | ICD-10-CM | POA: Diagnosis not present

## 2018-01-01 DIAGNOSIS — C801 Malignant (primary) neoplasm, unspecified: Secondary | ICD-10-CM | POA: Diagnosis not present

## 2018-01-01 DIAGNOSIS — Z5112 Encounter for antineoplastic immunotherapy: Secondary | ICD-10-CM | POA: Diagnosis not present

## 2018-01-22 DIAGNOSIS — Z5112 Encounter for antineoplastic immunotherapy: Secondary | ICD-10-CM | POA: Diagnosis not present

## 2018-01-22 DIAGNOSIS — C801 Malignant (primary) neoplasm, unspecified: Secondary | ICD-10-CM | POA: Diagnosis not present

## 2018-01-22 DIAGNOSIS — C7951 Secondary malignant neoplasm of bone: Secondary | ICD-10-CM | POA: Diagnosis not present

## 2018-01-31 DIAGNOSIS — R5381 Other malaise: Secondary | ICD-10-CM | POA: Diagnosis not present

## 2018-01-31 DIAGNOSIS — Z515 Encounter for palliative care: Secondary | ICD-10-CM | POA: Diagnosis not present

## 2018-01-31 DIAGNOSIS — G893 Neoplasm related pain (acute) (chronic): Secondary | ICD-10-CM | POA: Diagnosis not present

## 2018-01-31 DIAGNOSIS — C7951 Secondary malignant neoplasm of bone: Secondary | ICD-10-CM | POA: Diagnosis not present

## 2018-02-12 DIAGNOSIS — Z515 Encounter for palliative care: Secondary | ICD-10-CM | POA: Diagnosis not present

## 2018-02-12 DIAGNOSIS — T451X5A Adverse effect of antineoplastic and immunosuppressive drugs, initial encounter: Secondary | ICD-10-CM | POA: Diagnosis not present

## 2018-02-12 DIAGNOSIS — Z79891 Long term (current) use of opiate analgesic: Secondary | ICD-10-CM | POA: Diagnosis not present

## 2018-02-12 DIAGNOSIS — Z5112 Encounter for antineoplastic immunotherapy: Secondary | ICD-10-CM | POA: Diagnosis not present

## 2018-02-12 DIAGNOSIS — G620 Drug-induced polyneuropathy: Secondary | ICD-10-CM | POA: Diagnosis not present

## 2018-02-12 DIAGNOSIS — C7951 Secondary malignant neoplasm of bone: Secondary | ICD-10-CM | POA: Diagnosis not present

## 2018-02-12 DIAGNOSIS — R5381 Other malaise: Secondary | ICD-10-CM | POA: Diagnosis not present

## 2018-02-12 DIAGNOSIS — G893 Neoplasm related pain (acute) (chronic): Secondary | ICD-10-CM | POA: Diagnosis not present

## 2018-02-12 DIAGNOSIS — Z79899 Other long term (current) drug therapy: Secondary | ICD-10-CM | POA: Diagnosis not present

## 2018-02-12 DIAGNOSIS — C50919 Malignant neoplasm of unspecified site of unspecified female breast: Secondary | ICD-10-CM | POA: Diagnosis not present

## 2018-03-01 DIAGNOSIS — C7951 Secondary malignant neoplasm of bone: Secondary | ICD-10-CM | POA: Diagnosis not present

## 2018-03-01 DIAGNOSIS — R918 Other nonspecific abnormal finding of lung field: Secondary | ICD-10-CM | POA: Diagnosis not present

## 2018-03-01 DIAGNOSIS — N6310 Unspecified lump in the right breast, unspecified quadrant: Secondary | ICD-10-CM | POA: Diagnosis not present

## 2018-03-05 DIAGNOSIS — Z79899 Other long term (current) drug therapy: Secondary | ICD-10-CM | POA: Diagnosis not present

## 2018-03-05 DIAGNOSIS — R079 Chest pain, unspecified: Secondary | ICD-10-CM | POA: Diagnosis not present

## 2018-03-05 DIAGNOSIS — Z171 Estrogen receptor negative status [ER-]: Secondary | ICD-10-CM | POA: Diagnosis not present

## 2018-03-05 DIAGNOSIS — Z1501 Genetic susceptibility to malignant neoplasm of breast: Secondary | ICD-10-CM | POA: Diagnosis not present

## 2018-03-05 DIAGNOSIS — Z803 Family history of malignant neoplasm of breast: Secondary | ICD-10-CM | POA: Diagnosis not present

## 2018-03-05 DIAGNOSIS — Z5112 Encounter for antineoplastic immunotherapy: Secondary | ICD-10-CM | POA: Diagnosis not present

## 2018-03-05 DIAGNOSIS — G5793 Unspecified mononeuropathy of bilateral lower limbs: Secondary | ICD-10-CM | POA: Diagnosis not present

## 2018-03-05 DIAGNOSIS — C7951 Secondary malignant neoplasm of bone: Secondary | ICD-10-CM | POA: Diagnosis not present

## 2018-03-05 DIAGNOSIS — Z853 Personal history of malignant neoplasm of breast: Secondary | ICD-10-CM | POA: Diagnosis not present

## 2018-03-05 DIAGNOSIS — R600 Localized edema: Secondary | ICD-10-CM | POA: Diagnosis not present

## 2018-03-05 DIAGNOSIS — I890 Lymphedema, not elsewhere classified: Secondary | ICD-10-CM | POA: Diagnosis not present

## 2018-03-05 DIAGNOSIS — R0789 Other chest pain: Secondary | ICD-10-CM | POA: Diagnosis not present

## 2018-03-19 DIAGNOSIS — Z515 Encounter for palliative care: Secondary | ICD-10-CM | POA: Diagnosis not present

## 2018-03-19 DIAGNOSIS — G893 Neoplasm related pain (acute) (chronic): Secondary | ICD-10-CM | POA: Diagnosis not present

## 2018-03-19 DIAGNOSIS — F419 Anxiety disorder, unspecified: Secondary | ICD-10-CM | POA: Diagnosis not present

## 2018-03-19 DIAGNOSIS — R5381 Other malaise: Secondary | ICD-10-CM | POA: Diagnosis not present

## 2018-03-26 DIAGNOSIS — R918 Other nonspecific abnormal finding of lung field: Secondary | ICD-10-CM | POA: Diagnosis not present

## 2018-03-26 DIAGNOSIS — I361 Nonrheumatic tricuspid (valve) insufficiency: Secondary | ICD-10-CM | POA: Diagnosis not present

## 2018-03-26 DIAGNOSIS — C7951 Secondary malignant neoplasm of bone: Secondary | ICD-10-CM | POA: Diagnosis not present

## 2018-03-26 DIAGNOSIS — M419 Scoliosis, unspecified: Secondary | ICD-10-CM | POA: Diagnosis not present

## 2018-03-26 DIAGNOSIS — R0609 Other forms of dyspnea: Secondary | ICD-10-CM | POA: Diagnosis not present

## 2018-03-26 DIAGNOSIS — C801 Malignant (primary) neoplasm, unspecified: Secondary | ICD-10-CM | POA: Diagnosis not present

## 2018-03-27 DIAGNOSIS — F4323 Adjustment disorder with mixed anxiety and depressed mood: Secondary | ICD-10-CM | POA: Diagnosis not present

## 2018-03-29 DIAGNOSIS — C7951 Secondary malignant neoplasm of bone: Secondary | ICD-10-CM | POA: Diagnosis not present

## 2018-03-29 DIAGNOSIS — Z5112 Encounter for antineoplastic immunotherapy: Secondary | ICD-10-CM | POA: Diagnosis not present

## 2018-03-29 DIAGNOSIS — C801 Malignant (primary) neoplasm, unspecified: Secondary | ICD-10-CM | POA: Diagnosis not present

## 2018-04-02 DIAGNOSIS — F4323 Adjustment disorder with mixed anxiety and depressed mood: Secondary | ICD-10-CM | POA: Diagnosis not present

## 2018-04-04 DIAGNOSIS — F4323 Adjustment disorder with mixed anxiety and depressed mood: Secondary | ICD-10-CM | POA: Diagnosis not present

## 2018-04-09 DIAGNOSIS — F4323 Adjustment disorder with mixed anxiety and depressed mood: Secondary | ICD-10-CM | POA: Diagnosis not present

## 2018-04-16 DIAGNOSIS — G893 Neoplasm related pain (acute) (chronic): Secondary | ICD-10-CM | POA: Diagnosis not present

## 2018-04-16 DIAGNOSIS — C801 Malignant (primary) neoplasm, unspecified: Secondary | ICD-10-CM | POA: Diagnosis not present

## 2018-04-16 DIAGNOSIS — I890 Lymphedema, not elsewhere classified: Secondary | ICD-10-CM | POA: Diagnosis not present

## 2018-04-16 DIAGNOSIS — Z136 Encounter for screening for cardiovascular disorders: Secondary | ICD-10-CM | POA: Diagnosis not present

## 2018-04-16 DIAGNOSIS — R21 Rash and other nonspecific skin eruption: Secondary | ICD-10-CM | POA: Diagnosis not present

## 2018-04-16 DIAGNOSIS — C50912 Malignant neoplasm of unspecified site of left female breast: Secondary | ICD-10-CM | POA: Diagnosis not present

## 2018-04-16 DIAGNOSIS — C778 Secondary and unspecified malignant neoplasm of lymph nodes of multiple regions: Secondary | ICD-10-CM | POA: Diagnosis not present

## 2018-04-16 DIAGNOSIS — R918 Other nonspecific abnormal finding of lung field: Secondary | ICD-10-CM | POA: Diagnosis not present

## 2018-04-16 DIAGNOSIS — C7951 Secondary malignant neoplasm of bone: Secondary | ICD-10-CM | POA: Diagnosis not present

## 2018-04-16 DIAGNOSIS — R590 Localized enlarged lymph nodes: Secondary | ICD-10-CM | POA: Diagnosis not present

## 2018-04-16 DIAGNOSIS — R600 Localized edema: Secondary | ICD-10-CM | POA: Diagnosis not present

## 2018-04-16 DIAGNOSIS — Z171 Estrogen receptor negative status [ER-]: Secondary | ICD-10-CM | POA: Diagnosis not present

## 2018-04-16 DIAGNOSIS — R0789 Other chest pain: Secondary | ICD-10-CM | POA: Diagnosis not present

## 2018-04-16 DIAGNOSIS — R0600 Dyspnea, unspecified: Secondary | ICD-10-CM | POA: Diagnosis not present

## 2018-04-18 DIAGNOSIS — L905 Scar conditions and fibrosis of skin: Secondary | ICD-10-CM | POA: Diagnosis not present

## 2018-04-18 DIAGNOSIS — I972 Postmastectomy lymphedema syndrome: Secondary | ICD-10-CM | POA: Diagnosis not present

## 2018-04-18 DIAGNOSIS — I890 Lymphedema, not elsewhere classified: Secondary | ICD-10-CM | POA: Diagnosis not present

## 2018-04-19 DIAGNOSIS — F4323 Adjustment disorder with mixed anxiety and depressed mood: Secondary | ICD-10-CM | POA: Diagnosis not present

## 2018-05-02 DIAGNOSIS — Z5112 Encounter for antineoplastic immunotherapy: Secondary | ICD-10-CM | POA: Diagnosis not present

## 2018-05-02 DIAGNOSIS — G893 Neoplasm related pain (acute) (chronic): Secondary | ICD-10-CM | POA: Diagnosis not present

## 2018-05-02 DIAGNOSIS — F419 Anxiety disorder, unspecified: Secondary | ICD-10-CM | POA: Diagnosis not present

## 2018-05-02 DIAGNOSIS — T451X5A Adverse effect of antineoplastic and immunosuppressive drugs, initial encounter: Secondary | ICD-10-CM | POA: Diagnosis not present

## 2018-05-02 DIAGNOSIS — G620 Drug-induced polyneuropathy: Secondary | ICD-10-CM | POA: Diagnosis not present

## 2018-05-02 DIAGNOSIS — C50912 Malignant neoplasm of unspecified site of left female breast: Secondary | ICD-10-CM | POA: Diagnosis not present

## 2018-05-02 DIAGNOSIS — Z170 Estrogen receptor positive status [ER+]: Secondary | ICD-10-CM | POA: Diagnosis not present

## 2018-05-02 DIAGNOSIS — R0600 Dyspnea, unspecified: Secondary | ICD-10-CM | POA: Diagnosis not present

## 2018-05-02 DIAGNOSIS — I890 Lymphedema, not elsewhere classified: Secondary | ICD-10-CM | POA: Diagnosis not present

## 2018-05-02 DIAGNOSIS — R079 Chest pain, unspecified: Secondary | ICD-10-CM | POA: Diagnosis not present

## 2018-05-02 DIAGNOSIS — R0602 Shortness of breath: Secondary | ICD-10-CM | POA: Diagnosis not present

## 2018-05-02 DIAGNOSIS — Z79899 Other long term (current) drug therapy: Secondary | ICD-10-CM | POA: Diagnosis not present

## 2018-05-02 DIAGNOSIS — R918 Other nonspecific abnormal finding of lung field: Secondary | ICD-10-CM | POA: Diagnosis not present

## 2018-05-02 DIAGNOSIS — Z515 Encounter for palliative care: Secondary | ICD-10-CM | POA: Diagnosis not present

## 2018-05-02 DIAGNOSIS — M545 Low back pain: Secondary | ICD-10-CM | POA: Diagnosis not present

## 2018-05-02 DIAGNOSIS — C7951 Secondary malignant neoplasm of bone: Secondary | ICD-10-CM | POA: Diagnosis not present

## 2018-05-02 DIAGNOSIS — R609 Edema, unspecified: Secondary | ICD-10-CM | POA: Diagnosis not present

## 2018-05-02 DIAGNOSIS — R0789 Other chest pain: Secondary | ICD-10-CM | POA: Diagnosis not present

## 2018-05-06 DIAGNOSIS — L905 Scar conditions and fibrosis of skin: Secondary | ICD-10-CM | POA: Diagnosis not present

## 2018-05-06 DIAGNOSIS — I890 Lymphedema, not elsewhere classified: Secondary | ICD-10-CM | POA: Diagnosis not present

## 2018-05-08 DIAGNOSIS — F4323 Adjustment disorder with mixed anxiety and depressed mood: Secondary | ICD-10-CM | POA: Diagnosis not present

## 2018-05-15 DIAGNOSIS — F4323 Adjustment disorder with mixed anxiety and depressed mood: Secondary | ICD-10-CM | POA: Diagnosis not present

## 2018-05-16 DIAGNOSIS — L905 Scar conditions and fibrosis of skin: Secondary | ICD-10-CM | POA: Diagnosis not present

## 2018-05-16 DIAGNOSIS — I890 Lymphedema, not elsewhere classified: Secondary | ICD-10-CM | POA: Diagnosis not present

## 2018-05-22 DIAGNOSIS — F4323 Adjustment disorder with mixed anxiety and depressed mood: Secondary | ICD-10-CM | POA: Diagnosis not present

## 2018-05-28 DIAGNOSIS — R0600 Dyspnea, unspecified: Secondary | ICD-10-CM | POA: Diagnosis not present

## 2018-05-28 DIAGNOSIS — M792 Neuralgia and neuritis, unspecified: Secondary | ICD-10-CM | POA: Diagnosis not present

## 2018-05-28 DIAGNOSIS — Z5112 Encounter for antineoplastic immunotherapy: Secondary | ICD-10-CM | POA: Diagnosis not present

## 2018-05-28 DIAGNOSIS — G5793 Unspecified mononeuropathy of bilateral lower limbs: Secondary | ICD-10-CM | POA: Diagnosis not present

## 2018-05-28 DIAGNOSIS — Z79899 Other long term (current) drug therapy: Secondary | ICD-10-CM | POA: Diagnosis not present

## 2018-05-28 DIAGNOSIS — C7951 Secondary malignant neoplasm of bone: Secondary | ICD-10-CM | POA: Diagnosis not present

## 2018-05-28 DIAGNOSIS — I890 Lymphedema, not elsewhere classified: Secondary | ICD-10-CM | POA: Diagnosis not present

## 2018-05-28 DIAGNOSIS — R9431 Abnormal electrocardiogram [ECG] [EKG]: Secondary | ICD-10-CM | POA: Diagnosis not present

## 2018-05-28 DIAGNOSIS — Z853 Personal history of malignant neoplasm of breast: Secondary | ICD-10-CM | POA: Diagnosis not present

## 2018-05-28 DIAGNOSIS — Z515 Encounter for palliative care: Secondary | ICD-10-CM | POA: Diagnosis not present

## 2018-05-28 DIAGNOSIS — G893 Neoplasm related pain (acute) (chronic): Secondary | ICD-10-CM | POA: Diagnosis not present

## 2018-05-29 DIAGNOSIS — F4323 Adjustment disorder with mixed anxiety and depressed mood: Secondary | ICD-10-CM | POA: Diagnosis not present

## 2018-06-03 DIAGNOSIS — I890 Lymphedema, not elsewhere classified: Secondary | ICD-10-CM | POA: Diagnosis not present

## 2018-06-03 DIAGNOSIS — L905 Scar conditions and fibrosis of skin: Secondary | ICD-10-CM | POA: Diagnosis not present

## 2018-06-05 DIAGNOSIS — F4323 Adjustment disorder with mixed anxiety and depressed mood: Secondary | ICD-10-CM | POA: Diagnosis not present

## 2018-06-12 DIAGNOSIS — F4323 Adjustment disorder with mixed anxiety and depressed mood: Secondary | ICD-10-CM | POA: Diagnosis not present

## 2018-06-18 DIAGNOSIS — Z1501 Genetic susceptibility to malignant neoplasm of breast: Secondary | ICD-10-CM | POA: Diagnosis not present

## 2018-06-18 DIAGNOSIS — Z5112 Encounter for antineoplastic immunotherapy: Secondary | ICD-10-CM | POA: Diagnosis not present

## 2018-06-18 DIAGNOSIS — G893 Neoplasm related pain (acute) (chronic): Secondary | ICD-10-CM | POA: Diagnosis not present

## 2018-06-18 DIAGNOSIS — Z79899 Other long term (current) drug therapy: Secondary | ICD-10-CM | POA: Diagnosis not present

## 2018-06-18 DIAGNOSIS — R0600 Dyspnea, unspecified: Secondary | ICD-10-CM | POA: Diagnosis not present

## 2018-06-18 DIAGNOSIS — I890 Lymphedema, not elsewhere classified: Secondary | ICD-10-CM | POA: Diagnosis not present

## 2018-06-18 DIAGNOSIS — R0789 Other chest pain: Secondary | ICD-10-CM | POA: Diagnosis not present

## 2018-06-18 DIAGNOSIS — Z170 Estrogen receptor positive status [ER+]: Secondary | ICD-10-CM | POA: Diagnosis not present

## 2018-06-18 DIAGNOSIS — Z853 Personal history of malignant neoplasm of breast: Secondary | ICD-10-CM | POA: Diagnosis not present

## 2018-06-18 DIAGNOSIS — R609 Edema, unspecified: Secondary | ICD-10-CM | POA: Diagnosis not present

## 2018-06-18 DIAGNOSIS — G629 Polyneuropathy, unspecified: Secondary | ICD-10-CM | POA: Diagnosis not present

## 2018-06-18 DIAGNOSIS — C7951 Secondary malignant neoplasm of bone: Secondary | ICD-10-CM | POA: Diagnosis not present

## 2018-06-19 DIAGNOSIS — F4323 Adjustment disorder with mixed anxiety and depressed mood: Secondary | ICD-10-CM | POA: Diagnosis not present

## 2018-06-20 DIAGNOSIS — L905 Scar conditions and fibrosis of skin: Secondary | ICD-10-CM | POA: Diagnosis not present

## 2018-06-20 DIAGNOSIS — I890 Lymphedema, not elsewhere classified: Secondary | ICD-10-CM | POA: Diagnosis not present

## 2018-06-26 DIAGNOSIS — F4323 Adjustment disorder with mixed anxiety and depressed mood: Secondary | ICD-10-CM | POA: Diagnosis not present

## 2018-07-03 DIAGNOSIS — F4323 Adjustment disorder with mixed anxiety and depressed mood: Secondary | ICD-10-CM | POA: Diagnosis not present

## 2018-07-09 DIAGNOSIS — G893 Neoplasm related pain (acute) (chronic): Secondary | ICD-10-CM | POA: Diagnosis not present

## 2018-07-09 DIAGNOSIS — C7951 Secondary malignant neoplasm of bone: Secondary | ICD-10-CM | POA: Diagnosis not present

## 2018-07-09 DIAGNOSIS — M792 Neuralgia and neuritis, unspecified: Secondary | ICD-10-CM | POA: Diagnosis not present

## 2018-07-09 DIAGNOSIS — Z79899 Other long term (current) drug therapy: Secondary | ICD-10-CM | POA: Diagnosis not present

## 2018-07-09 DIAGNOSIS — R432 Parageusia: Secondary | ICD-10-CM | POA: Diagnosis not present

## 2018-07-09 DIAGNOSIS — C50919 Malignant neoplasm of unspecified site of unspecified female breast: Secondary | ICD-10-CM | POA: Diagnosis not present

## 2018-07-09 DIAGNOSIS — Z515 Encounter for palliative care: Secondary | ICD-10-CM | POA: Diagnosis not present

## 2018-07-09 DIAGNOSIS — Z79891 Long term (current) use of opiate analgesic: Secondary | ICD-10-CM | POA: Diagnosis not present

## 2018-07-09 DIAGNOSIS — Z5112 Encounter for antineoplastic immunotherapy: Secondary | ICD-10-CM | POA: Diagnosis not present

## 2018-07-10 DIAGNOSIS — F4323 Adjustment disorder with mixed anxiety and depressed mood: Secondary | ICD-10-CM | POA: Diagnosis not present

## 2018-07-17 DIAGNOSIS — F4323 Adjustment disorder with mixed anxiety and depressed mood: Secondary | ICD-10-CM | POA: Diagnosis not present

## 2018-07-24 DIAGNOSIS — F4323 Adjustment disorder with mixed anxiety and depressed mood: Secondary | ICD-10-CM | POA: Diagnosis not present

## 2018-07-26 DIAGNOSIS — R918 Other nonspecific abnormal finding of lung field: Secondary | ICD-10-CM | POA: Diagnosis not present

## 2018-07-26 DIAGNOSIS — R590 Localized enlarged lymph nodes: Secondary | ICD-10-CM | POA: Diagnosis not present

## 2018-07-26 DIAGNOSIS — C7951 Secondary malignant neoplasm of bone: Secondary | ICD-10-CM | POA: Diagnosis not present

## 2018-07-26 DIAGNOSIS — C50919 Malignant neoplasm of unspecified site of unspecified female breast: Secondary | ICD-10-CM | POA: Diagnosis not present

## 2018-07-26 DIAGNOSIS — N6310 Unspecified lump in the right breast, unspecified quadrant: Secondary | ICD-10-CM | POA: Diagnosis not present

## 2018-07-30 DIAGNOSIS — R600 Localized edema: Secondary | ICD-10-CM | POA: Diagnosis not present

## 2018-07-30 DIAGNOSIS — I890 Lymphedema, not elsewhere classified: Secondary | ICD-10-CM | POA: Diagnosis not present

## 2018-07-30 DIAGNOSIS — K5903 Drug induced constipation: Secondary | ICD-10-CM | POA: Diagnosis not present

## 2018-07-30 DIAGNOSIS — R0789 Other chest pain: Secondary | ICD-10-CM | POA: Diagnosis not present

## 2018-07-30 DIAGNOSIS — T402X5A Adverse effect of other opioids, initial encounter: Secondary | ICD-10-CM | POA: Diagnosis not present

## 2018-07-30 DIAGNOSIS — M792 Neuralgia and neuritis, unspecified: Secondary | ICD-10-CM | POA: Diagnosis not present

## 2018-07-30 DIAGNOSIS — G893 Neoplasm related pain (acute) (chronic): Secondary | ICD-10-CM | POA: Diagnosis not present

## 2018-07-30 DIAGNOSIS — C7951 Secondary malignant neoplasm of bone: Secondary | ICD-10-CM | POA: Diagnosis not present

## 2018-07-30 DIAGNOSIS — Z515 Encounter for palliative care: Secondary | ICD-10-CM | POA: Diagnosis not present

## 2018-07-30 DIAGNOSIS — R630 Anorexia: Secondary | ICD-10-CM | POA: Diagnosis not present

## 2018-07-30 DIAGNOSIS — G5793 Unspecified mononeuropathy of bilateral lower limbs: Secondary | ICD-10-CM | POA: Diagnosis not present

## 2018-07-30 DIAGNOSIS — G629 Polyneuropathy, unspecified: Secondary | ICD-10-CM | POA: Diagnosis not present

## 2018-07-30 DIAGNOSIS — C50912 Malignant neoplasm of unspecified site of left female breast: Secondary | ICD-10-CM | POA: Diagnosis not present

## 2018-07-30 DIAGNOSIS — Z171 Estrogen receptor negative status [ER-]: Secondary | ICD-10-CM | POA: Diagnosis not present

## 2018-07-30 DIAGNOSIS — R5383 Other fatigue: Secondary | ICD-10-CM | POA: Diagnosis not present

## 2018-07-31 DIAGNOSIS — F4323 Adjustment disorder with mixed anxiety and depressed mood: Secondary | ICD-10-CM | POA: Diagnosis not present

## 2018-08-07 DIAGNOSIS — F4323 Adjustment disorder with mixed anxiety and depressed mood: Secondary | ICD-10-CM | POA: Diagnosis not present

## 2018-08-12 DIAGNOSIS — Z79899 Other long term (current) drug therapy: Secondary | ICD-10-CM | POA: Diagnosis not present

## 2018-08-12 DIAGNOSIS — Z853 Personal history of malignant neoplasm of breast: Secondary | ICD-10-CM | POA: Diagnosis not present

## 2018-08-12 DIAGNOSIS — Z5181 Encounter for therapeutic drug level monitoring: Secondary | ICD-10-CM | POA: Diagnosis not present

## 2018-08-12 DIAGNOSIS — R591 Generalized enlarged lymph nodes: Secondary | ICD-10-CM | POA: Diagnosis not present

## 2018-08-12 DIAGNOSIS — C7951 Secondary malignant neoplasm of bone: Secondary | ICD-10-CM | POA: Diagnosis not present

## 2018-08-12 DIAGNOSIS — Z006 Encounter for examination for normal comparison and control in clinical research program: Secondary | ICD-10-CM | POA: Diagnosis not present

## 2018-08-12 DIAGNOSIS — C770 Secondary and unspecified malignant neoplasm of lymph nodes of head, face and neck: Secondary | ICD-10-CM | POA: Diagnosis not present

## 2018-08-13 DIAGNOSIS — F329 Major depressive disorder, single episode, unspecified: Secondary | ICD-10-CM | POA: Diagnosis not present

## 2018-08-13 DIAGNOSIS — C50919 Malignant neoplasm of unspecified site of unspecified female breast: Secondary | ICD-10-CM | POA: Diagnosis not present

## 2018-08-13 DIAGNOSIS — I10 Essential (primary) hypertension: Secondary | ICD-10-CM | POA: Diagnosis not present

## 2018-08-14 DIAGNOSIS — C801 Malignant (primary) neoplasm, unspecified: Secondary | ICD-10-CM | POA: Diagnosis not present

## 2018-08-14 DIAGNOSIS — I082 Rheumatic disorders of both aortic and tricuspid valves: Secondary | ICD-10-CM | POA: Diagnosis not present

## 2018-08-14 DIAGNOSIS — C7951 Secondary malignant neoplasm of bone: Secondary | ICD-10-CM | POA: Diagnosis not present

## 2018-08-14 DIAGNOSIS — Z006 Encounter for examination for normal comparison and control in clinical research program: Secondary | ICD-10-CM | POA: Diagnosis not present

## 2018-08-21 DIAGNOSIS — F4323 Adjustment disorder with mixed anxiety and depressed mood: Secondary | ICD-10-CM | POA: Diagnosis not present

## 2018-08-26 DIAGNOSIS — C50911 Malignant neoplasm of unspecified site of right female breast: Secondary | ICD-10-CM | POA: Diagnosis not present

## 2018-08-26 DIAGNOSIS — C50919 Malignant neoplasm of unspecified site of unspecified female breast: Secondary | ICD-10-CM | POA: Diagnosis not present

## 2018-08-26 DIAGNOSIS — G5793 Unspecified mononeuropathy of bilateral lower limbs: Secondary | ICD-10-CM | POA: Diagnosis not present

## 2018-08-26 DIAGNOSIS — I890 Lymphedema, not elsewhere classified: Secondary | ICD-10-CM | POA: Diagnosis not present

## 2018-08-26 DIAGNOSIS — Z171 Estrogen receptor negative status [ER-]: Secondary | ICD-10-CM | POA: Diagnosis not present

## 2018-08-26 DIAGNOSIS — Z006 Encounter for examination for normal comparison and control in clinical research program: Secondary | ICD-10-CM | POA: Diagnosis not present

## 2018-08-26 DIAGNOSIS — R0789 Other chest pain: Secondary | ICD-10-CM | POA: Diagnosis not present

## 2018-08-26 DIAGNOSIS — R600 Localized edema: Secondary | ICD-10-CM | POA: Diagnosis not present

## 2018-08-26 DIAGNOSIS — G629 Polyneuropathy, unspecified: Secondary | ICD-10-CM | POA: Diagnosis not present

## 2018-08-26 DIAGNOSIS — R928 Other abnormal and inconclusive findings on diagnostic imaging of breast: Secondary | ICD-10-CM | POA: Diagnosis not present

## 2018-08-26 DIAGNOSIS — C7951 Secondary malignant neoplasm of bone: Secondary | ICD-10-CM | POA: Diagnosis not present

## 2018-08-26 DIAGNOSIS — C50912 Malignant neoplasm of unspecified site of left female breast: Secondary | ICD-10-CM | POA: Diagnosis not present

## 2018-09-02 DIAGNOSIS — Z006 Encounter for examination for normal comparison and control in clinical research program: Secondary | ICD-10-CM | POA: Diagnosis not present

## 2018-09-02 DIAGNOSIS — Z853 Personal history of malignant neoplasm of breast: Secondary | ICD-10-CM | POA: Diagnosis not present

## 2018-09-02 DIAGNOSIS — R0789 Other chest pain: Secondary | ICD-10-CM | POA: Diagnosis not present

## 2018-09-02 DIAGNOSIS — C770 Secondary and unspecified malignant neoplasm of lymph nodes of head, face and neck: Secondary | ICD-10-CM | POA: Diagnosis not present

## 2018-09-02 DIAGNOSIS — G893 Neoplasm related pain (acute) (chronic): Secondary | ICD-10-CM | POA: Diagnosis not present

## 2018-09-02 DIAGNOSIS — I890 Lymphedema, not elsewhere classified: Secondary | ICD-10-CM | POA: Diagnosis not present

## 2018-09-02 DIAGNOSIS — Z5112 Encounter for antineoplastic immunotherapy: Secondary | ICD-10-CM | POA: Diagnosis not present

## 2018-09-02 DIAGNOSIS — G5793 Unspecified mononeuropathy of bilateral lower limbs: Secondary | ICD-10-CM | POA: Diagnosis not present

## 2018-09-02 DIAGNOSIS — C792 Secondary malignant neoplasm of skin: Secondary | ICD-10-CM | POA: Diagnosis not present

## 2018-09-02 DIAGNOSIS — Z5111 Encounter for antineoplastic chemotherapy: Secondary | ICD-10-CM | POA: Diagnosis not present

## 2018-09-02 DIAGNOSIS — C7951 Secondary malignant neoplasm of bone: Secondary | ICD-10-CM | POA: Diagnosis not present

## 2018-09-07 IMAGING — CR DG CHEST 2V
2 series · 2 of 2 positions shown · non-contrast
Comparison: None.

CLINICAL DATA: Fever.  Chemotherapy.

EXAM:
CHEST  2 VIEW

[w chest lat]
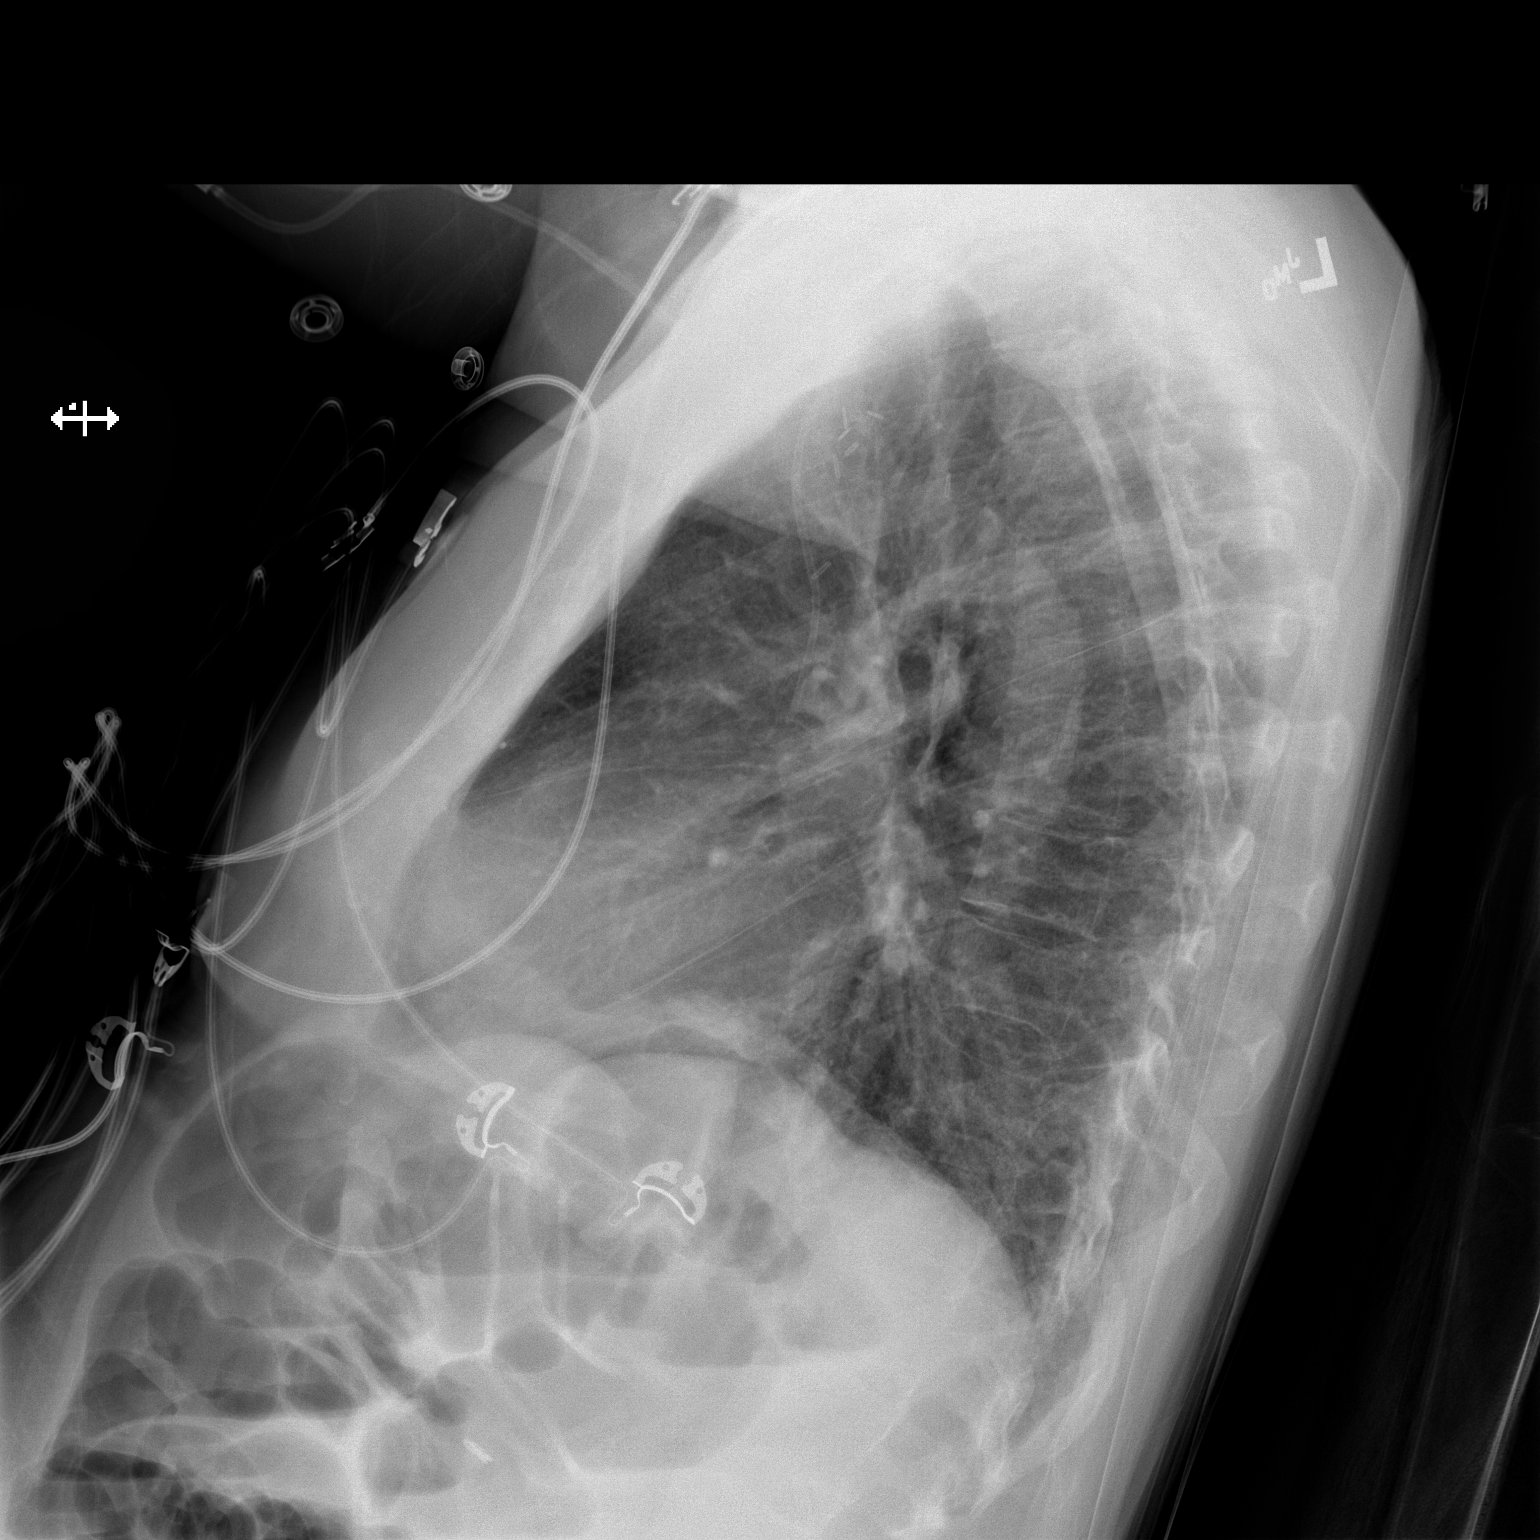

[x chest ap]
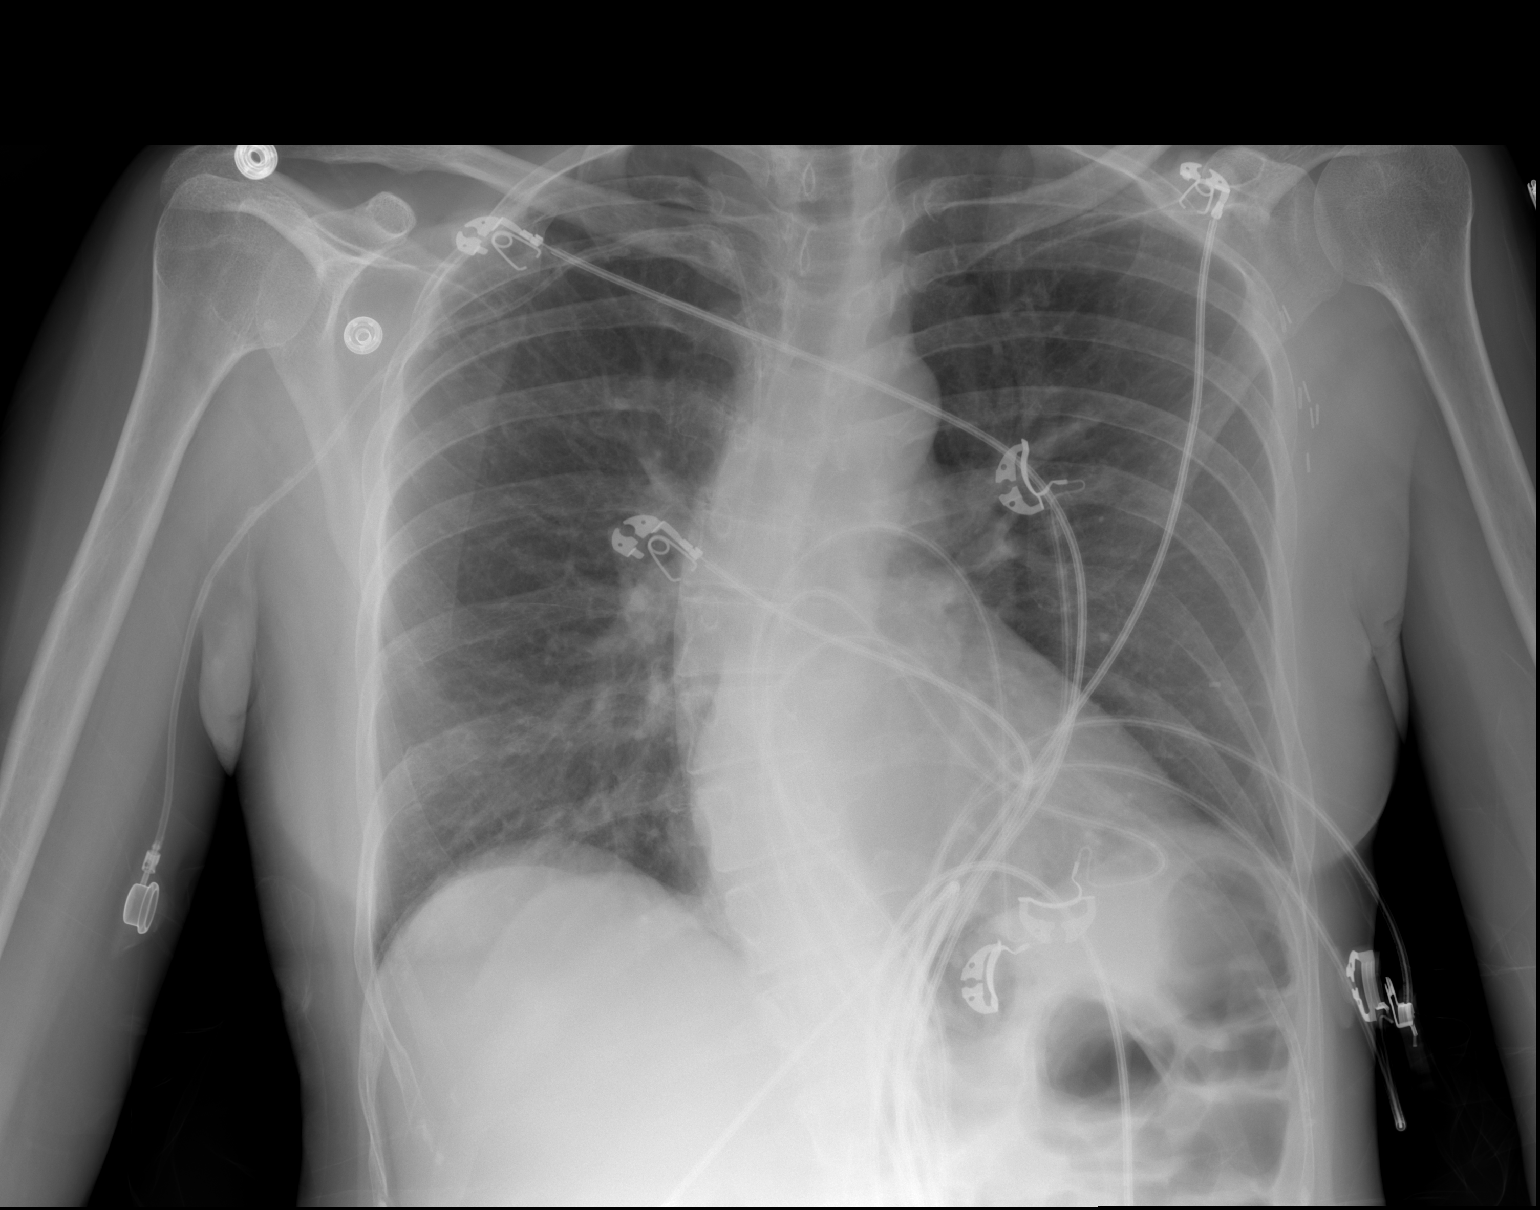

[2 of 2 positions shown; findings below may reference images not displayed]

FINDINGS: There is a right-sided line terminating in the central SVC. The
heart, hila, and mediastinum are normal. No pneumothorax. No nodules
or masses. No focal infiltrates.
IMPRESSION: No cause for fever is identified.

## 2018-09-09 DIAGNOSIS — Z5111 Encounter for antineoplastic chemotherapy: Secondary | ICD-10-CM | POA: Diagnosis not present

## 2018-09-09 DIAGNOSIS — M792 Neuralgia and neuritis, unspecified: Secondary | ICD-10-CM | POA: Diagnosis not present

## 2018-09-09 DIAGNOSIS — K5903 Drug induced constipation: Secondary | ICD-10-CM | POA: Diagnosis not present

## 2018-09-09 DIAGNOSIS — Z515 Encounter for palliative care: Secondary | ICD-10-CM | POA: Diagnosis not present

## 2018-09-09 DIAGNOSIS — G629 Polyneuropathy, unspecified: Secondary | ICD-10-CM | POA: Diagnosis not present

## 2018-09-09 DIAGNOSIS — T402X5A Adverse effect of other opioids, initial encounter: Secondary | ICD-10-CM | POA: Diagnosis not present

## 2018-09-09 DIAGNOSIS — Z79899 Other long term (current) drug therapy: Secondary | ICD-10-CM | POA: Diagnosis not present

## 2018-09-09 DIAGNOSIS — G893 Neoplasm related pain (acute) (chronic): Secondary | ICD-10-CM | POA: Diagnosis not present

## 2018-09-09 DIAGNOSIS — Z006 Encounter for examination for normal comparison and control in clinical research program: Secondary | ICD-10-CM | POA: Diagnosis not present

## 2018-09-09 DIAGNOSIS — C7951 Secondary malignant neoplasm of bone: Secondary | ICD-10-CM | POA: Diagnosis not present

## 2018-09-09 DIAGNOSIS — C50919 Malignant neoplasm of unspecified site of unspecified female breast: Secondary | ICD-10-CM | POA: Diagnosis not present

## 2018-09-11 DIAGNOSIS — F4323 Adjustment disorder with mixed anxiety and depressed mood: Secondary | ICD-10-CM | POA: Diagnosis not present

## 2018-09-16 DIAGNOSIS — C7951 Secondary malignant neoplasm of bone: Secondary | ICD-10-CM | POA: Diagnosis not present

## 2018-09-16 DIAGNOSIS — C801 Malignant (primary) neoplasm, unspecified: Secondary | ICD-10-CM | POA: Diagnosis not present

## 2018-09-16 DIAGNOSIS — Z5112 Encounter for antineoplastic immunotherapy: Secondary | ICD-10-CM | POA: Diagnosis not present

## 2018-09-16 DIAGNOSIS — E871 Hypo-osmolality and hyponatremia: Secondary | ICD-10-CM | POA: Diagnosis not present

## 2018-09-16 DIAGNOSIS — Z006 Encounter for examination for normal comparison and control in clinical research program: Secondary | ICD-10-CM | POA: Diagnosis not present

## 2018-09-18 DIAGNOSIS — F4323 Adjustment disorder with mixed anxiety and depressed mood: Secondary | ICD-10-CM | POA: Diagnosis not present

## 2018-09-23 DIAGNOSIS — R0789 Other chest pain: Secondary | ICD-10-CM | POA: Diagnosis not present

## 2018-09-23 DIAGNOSIS — C7951 Secondary malignant neoplasm of bone: Secondary | ICD-10-CM | POA: Diagnosis not present

## 2018-09-23 DIAGNOSIS — B0223 Postherpetic polyneuropathy: Secondary | ICD-10-CM | POA: Diagnosis not present

## 2018-09-23 DIAGNOSIS — G893 Neoplasm related pain (acute) (chronic): Secondary | ICD-10-CM | POA: Diagnosis not present

## 2018-09-23 DIAGNOSIS — G5793 Unspecified mononeuropathy of bilateral lower limbs: Secondary | ICD-10-CM | POA: Diagnosis not present

## 2018-09-23 DIAGNOSIS — I890 Lymphedema, not elsewhere classified: Secondary | ICD-10-CM | POA: Diagnosis not present

## 2018-09-30 DIAGNOSIS — Z5111 Encounter for antineoplastic chemotherapy: Secondary | ICD-10-CM | POA: Diagnosis not present

## 2018-09-30 DIAGNOSIS — G5793 Unspecified mononeuropathy of bilateral lower limbs: Secondary | ICD-10-CM | POA: Diagnosis not present

## 2018-09-30 DIAGNOSIS — Z006 Encounter for examination for normal comparison and control in clinical research program: Secondary | ICD-10-CM | POA: Diagnosis not present

## 2018-09-30 DIAGNOSIS — Z803 Family history of malignant neoplasm of breast: Secondary | ICD-10-CM | POA: Diagnosis not present

## 2018-09-30 DIAGNOSIS — C770 Secondary and unspecified malignant neoplasm of lymph nodes of head, face and neck: Secondary | ICD-10-CM | POA: Diagnosis not present

## 2018-09-30 DIAGNOSIS — Z9012 Acquired absence of left breast and nipple: Secondary | ICD-10-CM | POA: Diagnosis not present

## 2018-09-30 DIAGNOSIS — Z79899 Other long term (current) drug therapy: Secondary | ICD-10-CM | POA: Diagnosis not present

## 2018-09-30 DIAGNOSIS — C7951 Secondary malignant neoplasm of bone: Secondary | ICD-10-CM | POA: Diagnosis not present

## 2018-09-30 DIAGNOSIS — Z5112 Encounter for antineoplastic immunotherapy: Secondary | ICD-10-CM | POA: Diagnosis not present

## 2018-09-30 DIAGNOSIS — I890 Lymphedema, not elsewhere classified: Secondary | ICD-10-CM | POA: Diagnosis not present

## 2018-09-30 DIAGNOSIS — Z171 Estrogen receptor negative status [ER-]: Secondary | ICD-10-CM | POA: Diagnosis not present

## 2018-09-30 DIAGNOSIS — G893 Neoplasm related pain (acute) (chronic): Secondary | ICD-10-CM | POA: Diagnosis not present

## 2018-09-30 DIAGNOSIS — B029 Zoster without complications: Secondary | ICD-10-CM | POA: Diagnosis not present

## 2018-09-30 DIAGNOSIS — Z923 Personal history of irradiation: Secondary | ICD-10-CM | POA: Diagnosis not present

## 2018-09-30 DIAGNOSIS — C50912 Malignant neoplasm of unspecified site of left female breast: Secondary | ICD-10-CM | POA: Diagnosis not present

## 2018-09-30 DIAGNOSIS — E871 Hypo-osmolality and hyponatremia: Secondary | ICD-10-CM | POA: Diagnosis not present

## 2018-09-30 DIAGNOSIS — G629 Polyneuropathy, unspecified: Secondary | ICD-10-CM | POA: Diagnosis not present

## 2018-09-30 DIAGNOSIS — R600 Localized edema: Secondary | ICD-10-CM | POA: Diagnosis not present

## 2018-10-02 DIAGNOSIS — F4323 Adjustment disorder with mixed anxiety and depressed mood: Secondary | ICD-10-CM | POA: Diagnosis not present

## 2018-10-07 DIAGNOSIS — G893 Neoplasm related pain (acute) (chronic): Secondary | ICD-10-CM | POA: Diagnosis not present

## 2018-10-07 DIAGNOSIS — B0229 Other postherpetic nervous system involvement: Secondary | ICD-10-CM | POA: Diagnosis not present

## 2018-10-07 DIAGNOSIS — K5903 Drug induced constipation: Secondary | ICD-10-CM | POA: Diagnosis not present

## 2018-10-07 DIAGNOSIS — Z006 Encounter for examination for normal comparison and control in clinical research program: Secondary | ICD-10-CM | POA: Diagnosis not present

## 2018-10-07 DIAGNOSIS — C7951 Secondary malignant neoplasm of bone: Secondary | ICD-10-CM | POA: Diagnosis not present

## 2018-10-07 DIAGNOSIS — T402X5A Adverse effect of other opioids, initial encounter: Secondary | ICD-10-CM | POA: Diagnosis not present

## 2018-10-07 DIAGNOSIS — M792 Neuralgia and neuritis, unspecified: Secondary | ICD-10-CM | POA: Diagnosis not present

## 2018-10-07 DIAGNOSIS — Z5111 Encounter for antineoplastic chemotherapy: Secondary | ICD-10-CM | POA: Diagnosis not present

## 2018-10-07 DIAGNOSIS — G629 Polyneuropathy, unspecified: Secondary | ICD-10-CM | POA: Diagnosis not present

## 2018-10-07 DIAGNOSIS — C50919 Malignant neoplasm of unspecified site of unspecified female breast: Secondary | ICD-10-CM | POA: Diagnosis not present

## 2018-10-09 DIAGNOSIS — F4323 Adjustment disorder with mixed anxiety and depressed mood: Secondary | ICD-10-CM | POA: Diagnosis not present

## 2018-10-14 DIAGNOSIS — Z5112 Encounter for antineoplastic immunotherapy: Secondary | ICD-10-CM | POA: Diagnosis not present

## 2018-10-14 DIAGNOSIS — E871 Hypo-osmolality and hyponatremia: Secondary | ICD-10-CM | POA: Diagnosis not present

## 2018-10-14 DIAGNOSIS — C801 Malignant (primary) neoplasm, unspecified: Secondary | ICD-10-CM | POA: Diagnosis not present

## 2018-10-14 DIAGNOSIS — C7951 Secondary malignant neoplasm of bone: Secondary | ICD-10-CM | POA: Diagnosis not present

## 2018-10-14 DIAGNOSIS — Z006 Encounter for examination for normal comparison and control in clinical research program: Secondary | ICD-10-CM | POA: Diagnosis not present

## 2018-10-23 DIAGNOSIS — F4323 Adjustment disorder with mixed anxiety and depressed mood: Secondary | ICD-10-CM | POA: Diagnosis not present

## 2018-10-25 DIAGNOSIS — D0510 Intraductal carcinoma in situ of unspecified breast: Secondary | ICD-10-CM | POA: Diagnosis not present

## 2018-10-25 DIAGNOSIS — C50919 Malignant neoplasm of unspecified site of unspecified female breast: Secondary | ICD-10-CM | POA: Diagnosis not present

## 2018-10-25 DIAGNOSIS — R918 Other nonspecific abnormal finding of lung field: Secondary | ICD-10-CM | POA: Diagnosis not present

## 2018-10-25 DIAGNOSIS — C7951 Secondary malignant neoplasm of bone: Secondary | ICD-10-CM | POA: Diagnosis not present

## 2018-10-25 DIAGNOSIS — Z006 Encounter for examination for normal comparison and control in clinical research program: Secondary | ICD-10-CM | POA: Diagnosis not present

## 2018-10-25 DIAGNOSIS — R590 Localized enlarged lymph nodes: Secondary | ICD-10-CM | POA: Diagnosis not present

## 2018-10-28 DIAGNOSIS — R5383 Other fatigue: Secondary | ICD-10-CM | POA: Diagnosis not present

## 2018-10-28 DIAGNOSIS — Z170 Estrogen receptor positive status [ER+]: Secondary | ICD-10-CM | POA: Diagnosis not present

## 2018-10-28 DIAGNOSIS — I890 Lymphedema, not elsewhere classified: Secondary | ICD-10-CM | POA: Diagnosis not present

## 2018-10-28 DIAGNOSIS — C50912 Malignant neoplasm of unspecified site of left female breast: Secondary | ICD-10-CM | POA: Diagnosis not present

## 2018-10-28 DIAGNOSIS — L989 Disorder of the skin and subcutaneous tissue, unspecified: Secondary | ICD-10-CM | POA: Diagnosis not present

## 2018-10-28 DIAGNOSIS — Z5112 Encounter for antineoplastic immunotherapy: Secondary | ICD-10-CM | POA: Diagnosis not present

## 2018-10-28 DIAGNOSIS — C7951 Secondary malignant neoplasm of bone: Secondary | ICD-10-CM | POA: Diagnosis not present

## 2018-10-28 DIAGNOSIS — Z006 Encounter for examination for normal comparison and control in clinical research program: Secondary | ICD-10-CM | POA: Diagnosis not present

## 2018-10-28 DIAGNOSIS — Z5111 Encounter for antineoplastic chemotherapy: Secondary | ICD-10-CM | POA: Diagnosis not present

## 2018-10-28 DIAGNOSIS — B029 Zoster without complications: Secondary | ICD-10-CM | POA: Diagnosis not present

## 2018-10-28 DIAGNOSIS — E871 Hypo-osmolality and hyponatremia: Secondary | ICD-10-CM | POA: Diagnosis not present

## 2018-10-28 DIAGNOSIS — R0789 Other chest pain: Secondary | ICD-10-CM | POA: Diagnosis not present

## 2018-10-28 DIAGNOSIS — R600 Localized edema: Secondary | ICD-10-CM | POA: Diagnosis not present

## 2018-10-28 DIAGNOSIS — Z79899 Other long term (current) drug therapy: Secondary | ICD-10-CM | POA: Diagnosis not present

## 2018-10-28 DIAGNOSIS — G629 Polyneuropathy, unspecified: Secondary | ICD-10-CM | POA: Diagnosis not present

## 2018-10-30 DIAGNOSIS — F4323 Adjustment disorder with mixed anxiety and depressed mood: Secondary | ICD-10-CM | POA: Diagnosis not present

## 2018-11-04 DIAGNOSIS — C7951 Secondary malignant neoplasm of bone: Secondary | ICD-10-CM | POA: Diagnosis not present

## 2018-11-04 DIAGNOSIS — Z006 Encounter for examination for normal comparison and control in clinical research program: Secondary | ICD-10-CM | POA: Diagnosis not present

## 2018-11-04 DIAGNOSIS — Z5111 Encounter for antineoplastic chemotherapy: Secondary | ICD-10-CM | POA: Diagnosis not present

## 2018-11-04 DIAGNOSIS — T402X5A Adverse effect of other opioids, initial encounter: Secondary | ICD-10-CM | POA: Diagnosis not present

## 2018-11-04 DIAGNOSIS — Z79899 Other long term (current) drug therapy: Secondary | ICD-10-CM | POA: Diagnosis not present

## 2018-11-04 DIAGNOSIS — B0229 Other postherpetic nervous system involvement: Secondary | ICD-10-CM | POA: Diagnosis not present

## 2018-11-04 DIAGNOSIS — C50919 Malignant neoplasm of unspecified site of unspecified female breast: Secondary | ICD-10-CM | POA: Diagnosis not present

## 2018-11-04 DIAGNOSIS — G893 Neoplasm related pain (acute) (chronic): Secondary | ICD-10-CM | POA: Diagnosis not present

## 2018-11-04 DIAGNOSIS — M792 Neuralgia and neuritis, unspecified: Secondary | ICD-10-CM | POA: Diagnosis not present

## 2018-11-04 DIAGNOSIS — K5903 Drug induced constipation: Secondary | ICD-10-CM | POA: Diagnosis not present

## 2018-11-06 DIAGNOSIS — F4323 Adjustment disorder with mixed anxiety and depressed mood: Secondary | ICD-10-CM | POA: Diagnosis not present

## 2018-11-11 DIAGNOSIS — C7951 Secondary malignant neoplasm of bone: Secondary | ICD-10-CM | POA: Diagnosis not present

## 2018-11-11 DIAGNOSIS — C801 Malignant (primary) neoplasm, unspecified: Secondary | ICD-10-CM | POA: Diagnosis not present

## 2018-11-11 DIAGNOSIS — Z006 Encounter for examination for normal comparison and control in clinical research program: Secondary | ICD-10-CM | POA: Diagnosis not present

## 2018-11-11 DIAGNOSIS — E871 Hypo-osmolality and hyponatremia: Secondary | ICD-10-CM | POA: Diagnosis not present

## 2018-11-11 DIAGNOSIS — Z5112 Encounter for antineoplastic immunotherapy: Secondary | ICD-10-CM | POA: Diagnosis not present

## 2018-11-11 DIAGNOSIS — Z5111 Encounter for antineoplastic chemotherapy: Secondary | ICD-10-CM | POA: Diagnosis not present

## 2018-11-13 DIAGNOSIS — F4323 Adjustment disorder with mixed anxiety and depressed mood: Secondary | ICD-10-CM | POA: Diagnosis not present

## 2018-11-20 DIAGNOSIS — F4323 Adjustment disorder with mixed anxiety and depressed mood: Secondary | ICD-10-CM | POA: Diagnosis not present

## 2018-11-25 DIAGNOSIS — Z170 Estrogen receptor positive status [ER+]: Secondary | ICD-10-CM | POA: Diagnosis not present

## 2018-11-25 DIAGNOSIS — R600 Localized edema: Secondary | ICD-10-CM | POA: Diagnosis not present

## 2018-11-25 DIAGNOSIS — G629 Polyneuropathy, unspecified: Secondary | ICD-10-CM | POA: Diagnosis not present

## 2018-11-25 DIAGNOSIS — B029 Zoster without complications: Secondary | ICD-10-CM | POA: Diagnosis not present

## 2018-11-25 DIAGNOSIS — G893 Neoplasm related pain (acute) (chronic): Secondary | ICD-10-CM | POA: Diagnosis not present

## 2018-11-25 DIAGNOSIS — Z79899 Other long term (current) drug therapy: Secondary | ICD-10-CM | POA: Diagnosis not present

## 2018-11-25 DIAGNOSIS — Z006 Encounter for examination for normal comparison and control in clinical research program: Secondary | ICD-10-CM | POA: Diagnosis not present

## 2018-11-25 DIAGNOSIS — C50912 Malignant neoplasm of unspecified site of left female breast: Secondary | ICD-10-CM | POA: Diagnosis not present

## 2018-11-25 DIAGNOSIS — R21 Rash and other nonspecific skin eruption: Secondary | ICD-10-CM | POA: Diagnosis not present

## 2018-11-25 DIAGNOSIS — I890 Lymphedema, not elsewhere classified: Secondary | ICD-10-CM | POA: Diagnosis not present

## 2018-11-25 DIAGNOSIS — N6310 Unspecified lump in the right breast, unspecified quadrant: Secondary | ICD-10-CM | POA: Diagnosis not present

## 2018-11-25 DIAGNOSIS — C770 Secondary and unspecified malignant neoplasm of lymph nodes of head, face and neck: Secondary | ICD-10-CM | POA: Diagnosis not present

## 2018-11-25 DIAGNOSIS — C7951 Secondary malignant neoplasm of bone: Secondary | ICD-10-CM | POA: Diagnosis not present

## 2018-11-25 DIAGNOSIS — B0223 Postherpetic polyneuropathy: Secondary | ICD-10-CM | POA: Diagnosis not present

## 2018-12-01 IMAGING — CR DG CHEST 2V
2 series · 2 of 2 positions shown · non-contrast
Comparison: July 26, 2017

CLINICAL DATA: Fever

EXAM:
CHEST  2 VIEW

[w chest pa]
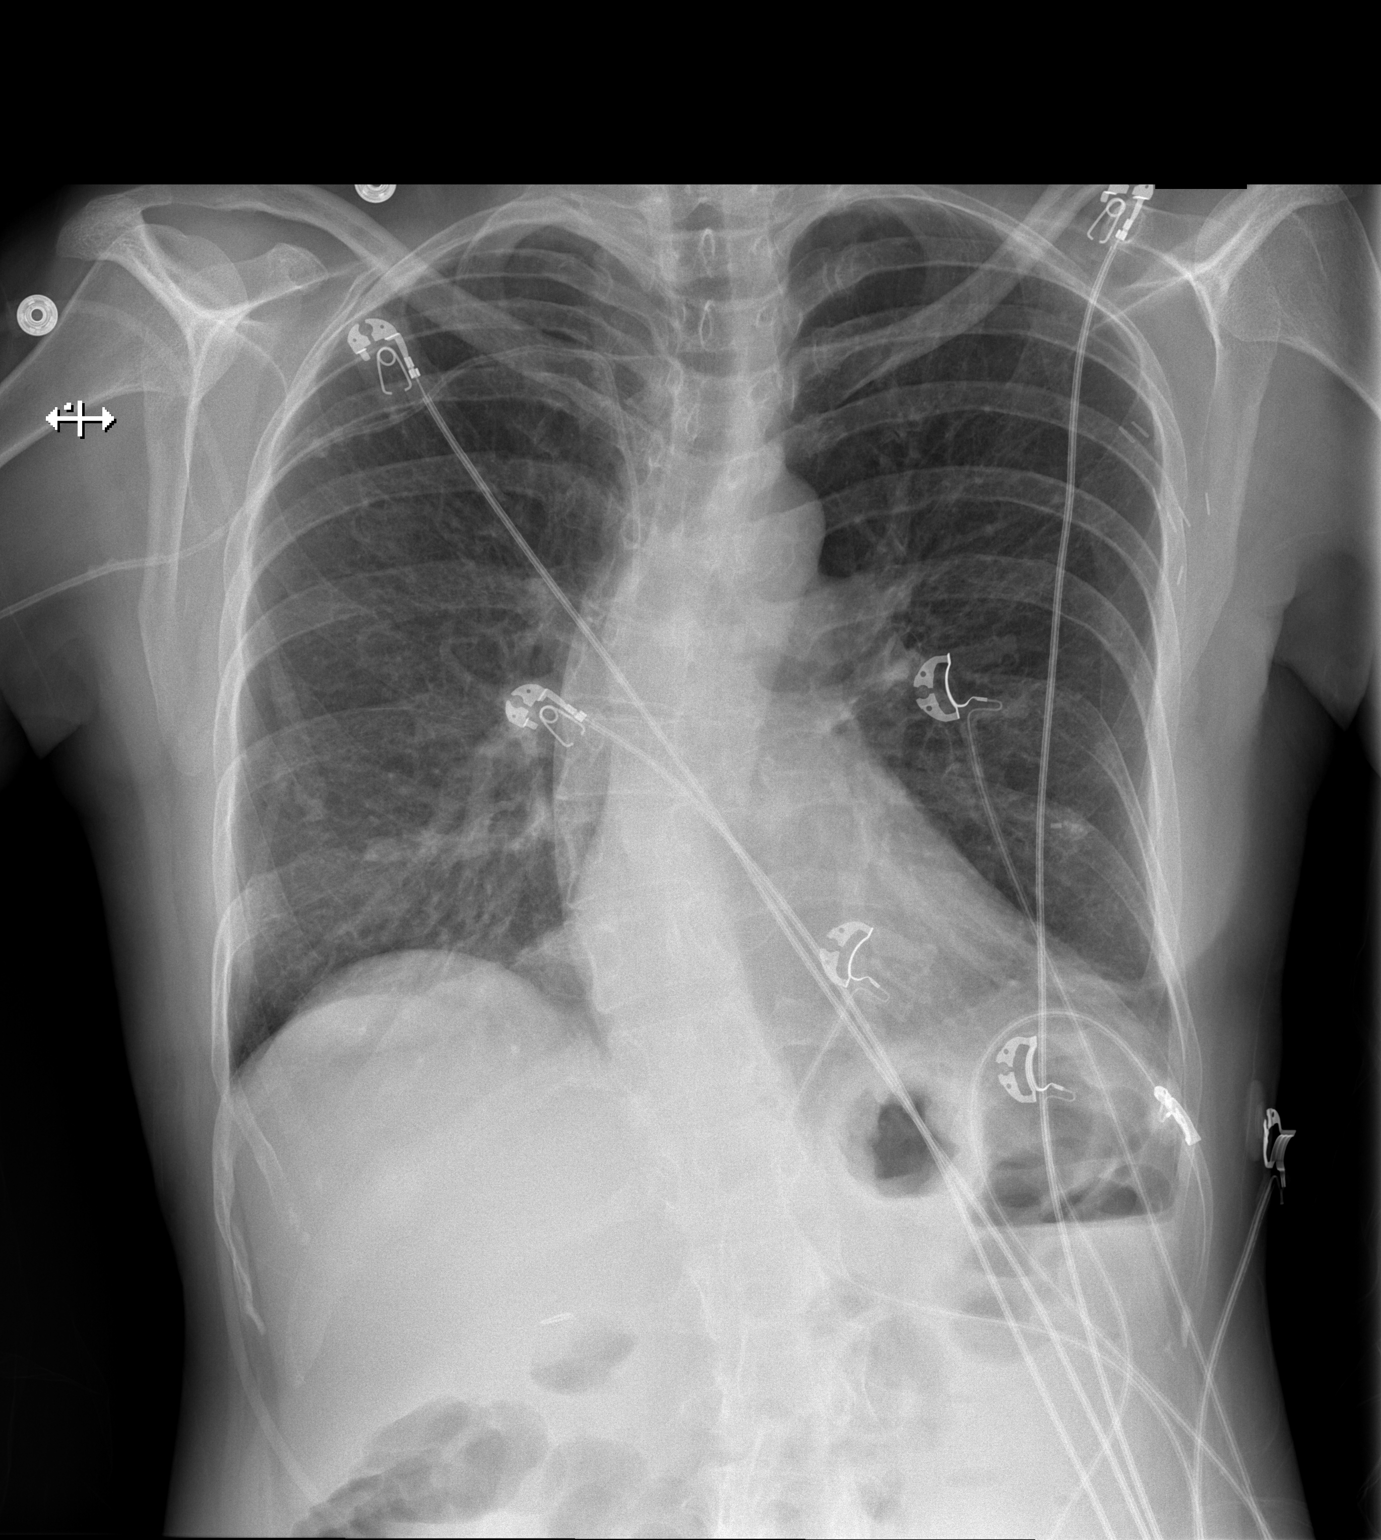

[w chest lat]
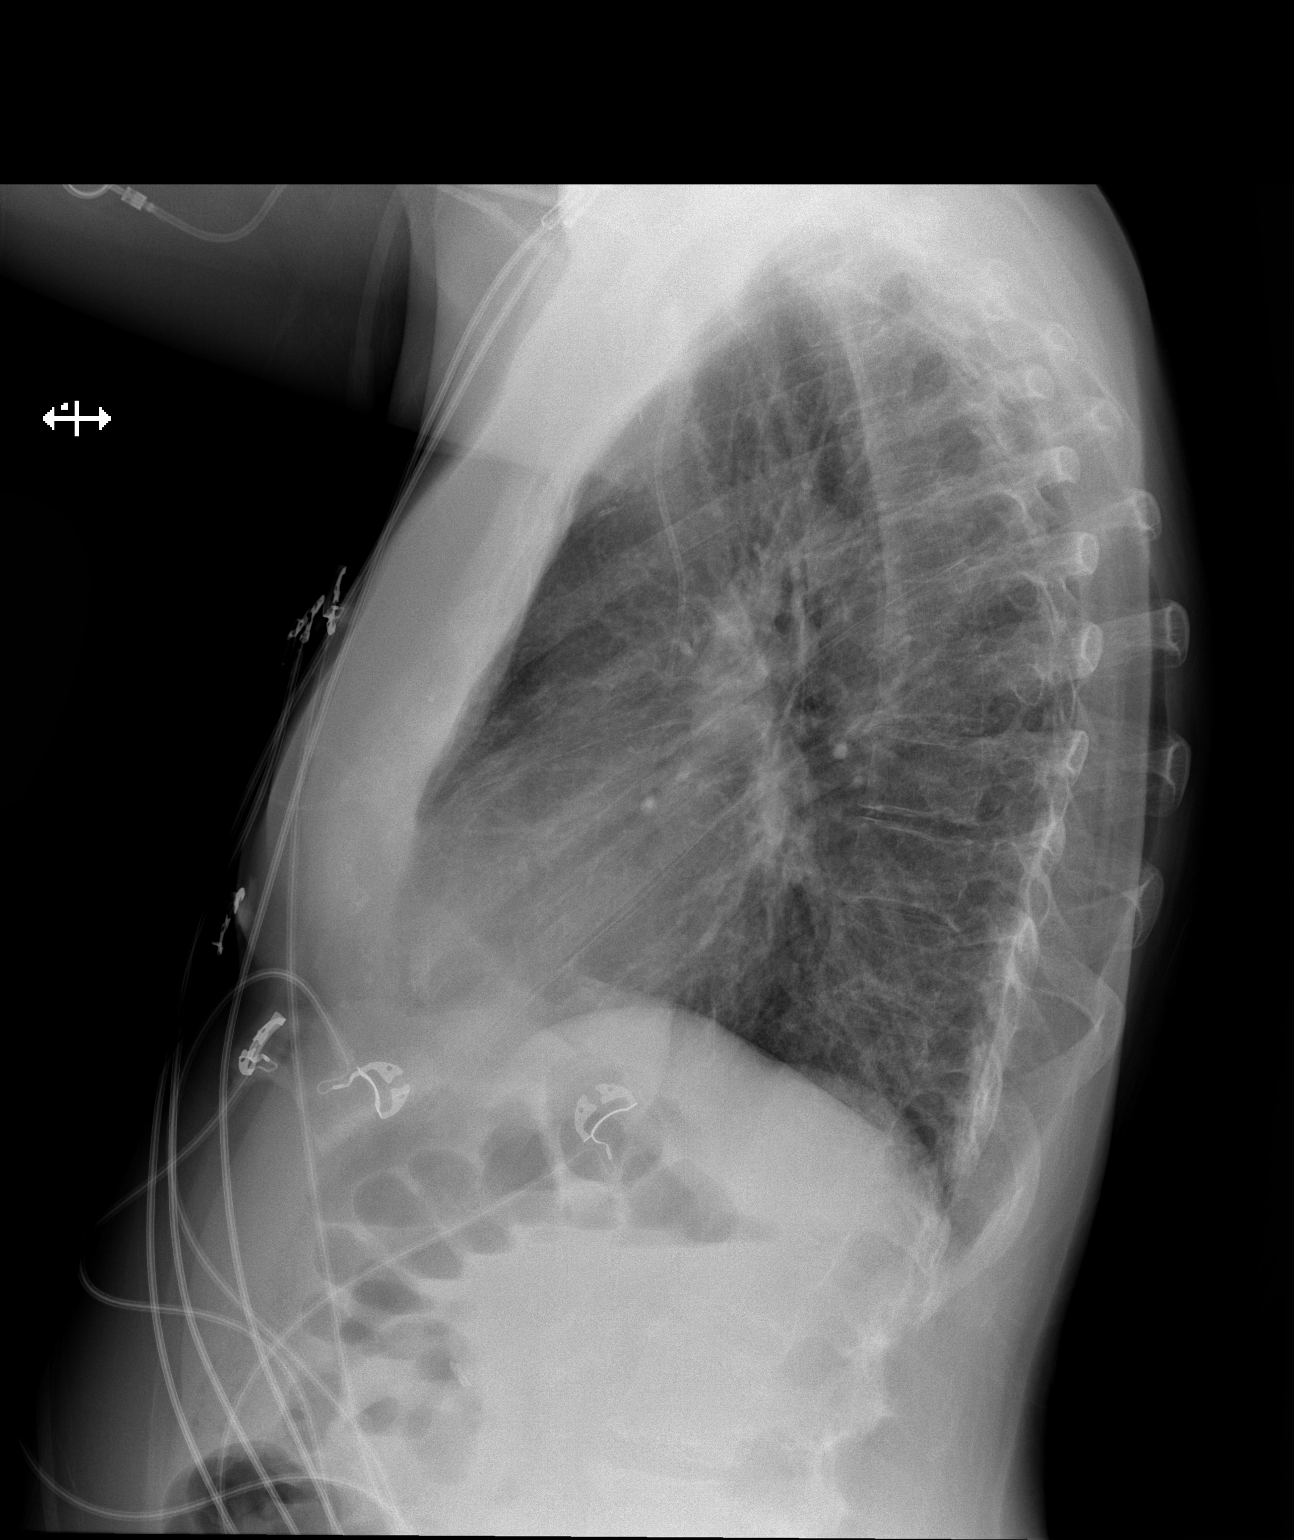

[2 of 2 positions shown; findings below may reference images not displayed]

FINDINGS: There are probable nipple shadows on each side. In addition, there
is a 6 mm nodular opacity in the right lower lobe.

There is no edema or consolidation. Heart size and pulmonary
vascularity are normal. No adenopathy. Central catheter tip is in
the superior vena cava. No pneumothorax.

There is midthoracic dextroscoliosis with thoracolumbar
levoscoliosis.

There are surgical clips in left axilla.
IMPRESSION: 6 mm nodular opacity right lower lobe. This finding was not present
on prior study. This finding warrants noncontrast enhanced chest CT
to further assess. There are in addition probable nipple shadows
bilaterally.

No edema or consolidation. No evident adenopathy. Central catheter
tip in superior vena cava. No evident pneumothorax.

## 2018-12-02 DIAGNOSIS — Z5112 Encounter for antineoplastic immunotherapy: Secondary | ICD-10-CM | POA: Diagnosis not present

## 2018-12-02 DIAGNOSIS — Z006 Encounter for examination for normal comparison and control in clinical research program: Secondary | ICD-10-CM | POA: Diagnosis not present

## 2018-12-02 DIAGNOSIS — B0229 Other postherpetic nervous system involvement: Secondary | ICD-10-CM | POA: Diagnosis not present

## 2018-12-02 DIAGNOSIS — I890 Lymphedema, not elsewhere classified: Secondary | ICD-10-CM | POA: Diagnosis not present

## 2018-12-02 DIAGNOSIS — Z5111 Encounter for antineoplastic chemotherapy: Secondary | ICD-10-CM | POA: Diagnosis not present

## 2018-12-02 DIAGNOSIS — C801 Malignant (primary) neoplasm, unspecified: Secondary | ICD-10-CM | POA: Diagnosis not present

## 2018-12-02 DIAGNOSIS — G5793 Unspecified mononeuropathy of bilateral lower limbs: Secondary | ICD-10-CM | POA: Diagnosis not present

## 2018-12-02 DIAGNOSIS — E871 Hypo-osmolality and hyponatremia: Secondary | ICD-10-CM | POA: Diagnosis not present

## 2018-12-02 DIAGNOSIS — C7951 Secondary malignant neoplasm of bone: Secondary | ICD-10-CM | POA: Diagnosis not present

## 2018-12-02 DIAGNOSIS — I080 Rheumatic disorders of both mitral and aortic valves: Secondary | ICD-10-CM | POA: Diagnosis not present

## 2018-12-03 DIAGNOSIS — F4323 Adjustment disorder with mixed anxiety and depressed mood: Secondary | ICD-10-CM | POA: Diagnosis not present

## 2018-12-09 DIAGNOSIS — Z5111 Encounter for antineoplastic chemotherapy: Secondary | ICD-10-CM | POA: Diagnosis not present

## 2018-12-09 DIAGNOSIS — T402X5A Adverse effect of other opioids, initial encounter: Secondary | ICD-10-CM | POA: Diagnosis not present

## 2018-12-09 DIAGNOSIS — Z006 Encounter for examination for normal comparison and control in clinical research program: Secondary | ICD-10-CM | POA: Diagnosis not present

## 2018-12-09 DIAGNOSIS — G893 Neoplasm related pain (acute) (chronic): Secondary | ICD-10-CM | POA: Diagnosis not present

## 2018-12-09 DIAGNOSIS — C50912 Malignant neoplasm of unspecified site of left female breast: Secondary | ICD-10-CM | POA: Diagnosis not present

## 2018-12-09 DIAGNOSIS — K5903 Drug induced constipation: Secondary | ICD-10-CM | POA: Diagnosis not present

## 2018-12-09 DIAGNOSIS — C7951 Secondary malignant neoplasm of bone: Secondary | ICD-10-CM | POA: Diagnosis not present

## 2018-12-09 DIAGNOSIS — Z79891 Long term (current) use of opiate analgesic: Secondary | ICD-10-CM | POA: Diagnosis not present

## 2018-12-09 DIAGNOSIS — M792 Neuralgia and neuritis, unspecified: Secondary | ICD-10-CM | POA: Diagnosis not present

## 2018-12-11 DIAGNOSIS — F4323 Adjustment disorder with mixed anxiety and depressed mood: Secondary | ICD-10-CM | POA: Diagnosis not present

## 2018-12-16 DIAGNOSIS — Z5112 Encounter for antineoplastic immunotherapy: Secondary | ICD-10-CM | POA: Diagnosis not present

## 2018-12-16 DIAGNOSIS — C7951 Secondary malignant neoplasm of bone: Secondary | ICD-10-CM | POA: Diagnosis not present

## 2018-12-16 DIAGNOSIS — B0223 Postherpetic polyneuropathy: Secondary | ICD-10-CM | POA: Diagnosis not present

## 2018-12-16 DIAGNOSIS — G5793 Unspecified mononeuropathy of bilateral lower limbs: Secondary | ICD-10-CM | POA: Diagnosis not present

## 2018-12-16 DIAGNOSIS — C50912 Malignant neoplasm of unspecified site of left female breast: Secondary | ICD-10-CM | POA: Diagnosis not present

## 2018-12-16 DIAGNOSIS — Z006 Encounter for examination for normal comparison and control in clinical research program: Secondary | ICD-10-CM | POA: Diagnosis not present

## 2018-12-16 DIAGNOSIS — Z5111 Encounter for antineoplastic chemotherapy: Secondary | ICD-10-CM | POA: Diagnosis not present

## 2018-12-16 DIAGNOSIS — E871 Hypo-osmolality and hyponatremia: Secondary | ICD-10-CM | POA: Diagnosis not present

## 2018-12-18 DIAGNOSIS — F4323 Adjustment disorder with mixed anxiety and depressed mood: Secondary | ICD-10-CM | POA: Diagnosis not present

## 2018-12-27 DIAGNOSIS — Z006 Encounter for examination for normal comparison and control in clinical research program: Secondary | ICD-10-CM | POA: Diagnosis not present

## 2018-12-27 DIAGNOSIS — C50919 Malignant neoplasm of unspecified site of unspecified female breast: Secondary | ICD-10-CM | POA: Diagnosis not present

## 2018-12-27 DIAGNOSIS — R222 Localized swelling, mass and lump, trunk: Secondary | ICD-10-CM | POA: Diagnosis not present

## 2018-12-27 DIAGNOSIS — C50912 Malignant neoplasm of unspecified site of left female breast: Secondary | ICD-10-CM | POA: Diagnosis not present

## 2018-12-27 DIAGNOSIS — S2231XA Fracture of one rib, right side, initial encounter for closed fracture: Secondary | ICD-10-CM | POA: Diagnosis not present

## 2018-12-27 DIAGNOSIS — R590 Localized enlarged lymph nodes: Secondary | ICD-10-CM | POA: Diagnosis not present

## 2018-12-27 DIAGNOSIS — C7951 Secondary malignant neoplasm of bone: Secondary | ICD-10-CM | POA: Diagnosis not present

## 2018-12-27 DIAGNOSIS — R918 Other nonspecific abnormal finding of lung field: Secondary | ICD-10-CM | POA: Diagnosis not present

## 2018-12-30 DIAGNOSIS — C50912 Malignant neoplasm of unspecified site of left female breast: Secondary | ICD-10-CM | POA: Diagnosis not present

## 2018-12-30 DIAGNOSIS — I890 Lymphedema, not elsewhere classified: Secondary | ICD-10-CM | POA: Diagnosis not present

## 2018-12-30 DIAGNOSIS — G893 Neoplasm related pain (acute) (chronic): Secondary | ICD-10-CM | POA: Diagnosis not present

## 2018-12-30 DIAGNOSIS — K5903 Drug induced constipation: Secondary | ICD-10-CM | POA: Diagnosis not present

## 2018-12-30 DIAGNOSIS — R918 Other nonspecific abnormal finding of lung field: Secondary | ICD-10-CM | POA: Diagnosis not present

## 2018-12-30 DIAGNOSIS — C7951 Secondary malignant neoplasm of bone: Secondary | ICD-10-CM | POA: Diagnosis not present

## 2018-12-30 DIAGNOSIS — Z515 Encounter for palliative care: Secondary | ICD-10-CM | POA: Diagnosis not present

## 2018-12-30 DIAGNOSIS — T402X5A Adverse effect of other opioids, initial encounter: Secondary | ICD-10-CM | POA: Diagnosis not present

## 2018-12-30 DIAGNOSIS — R21 Rash and other nonspecific skin eruption: Secondary | ICD-10-CM | POA: Diagnosis not present

## 2018-12-30 DIAGNOSIS — M792 Neuralgia and neuritis, unspecified: Secondary | ICD-10-CM | POA: Diagnosis not present

## 2019-01-01 DIAGNOSIS — F4323 Adjustment disorder with mixed anxiety and depressed mood: Secondary | ICD-10-CM | POA: Diagnosis not present

## 2019-01-02 DIAGNOSIS — J849 Interstitial pulmonary disease, unspecified: Secondary | ICD-10-CM | POA: Diagnosis not present

## 2019-01-02 DIAGNOSIS — Z79899 Other long term (current) drug therapy: Secondary | ICD-10-CM | POA: Diagnosis not present

## 2019-01-02 DIAGNOSIS — Z9012 Acquired absence of left breast and nipple: Secondary | ICD-10-CM | POA: Diagnosis not present

## 2019-01-02 DIAGNOSIS — C50912 Malignant neoplasm of unspecified site of left female breast: Secondary | ICD-10-CM | POA: Diagnosis not present

## 2019-01-02 DIAGNOSIS — C7951 Secondary malignant neoplasm of bone: Secondary | ICD-10-CM | POA: Diagnosis not present

## 2019-01-02 DIAGNOSIS — Z77118 Contact with and (suspected) exposure to other environmental pollution: Secondary | ICD-10-CM | POA: Diagnosis not present

## 2019-01-02 DIAGNOSIS — J310 Chronic rhinitis: Secondary | ICD-10-CM | POA: Diagnosis not present

## 2019-01-02 DIAGNOSIS — R918 Other nonspecific abnormal finding of lung field: Secondary | ICD-10-CM | POA: Diagnosis not present

## 2019-01-02 DIAGNOSIS — J4540 Moderate persistent asthma, uncomplicated: Secondary | ICD-10-CM | POA: Diagnosis not present

## 2019-01-02 DIAGNOSIS — R0602 Shortness of breath: Secondary | ICD-10-CM | POA: Diagnosis not present

## 2019-01-04 DEATH — deceased

## 2019-01-08 DIAGNOSIS — F4323 Adjustment disorder with mixed anxiety and depressed mood: Secondary | ICD-10-CM | POA: Diagnosis not present

## 2019-01-09 DIAGNOSIS — G893 Neoplasm related pain (acute) (chronic): Secondary | ICD-10-CM | POA: Diagnosis not present

## 2019-01-09 DIAGNOSIS — C50912 Malignant neoplasm of unspecified site of left female breast: Secondary | ICD-10-CM | POA: Diagnosis not present

## 2019-01-09 DIAGNOSIS — C7951 Secondary malignant neoplasm of bone: Secondary | ICD-10-CM | POA: Diagnosis not present

## 2019-01-10 DIAGNOSIS — R0602 Shortness of breath: Secondary | ICD-10-CM | POA: Diagnosis not present

## 2019-01-10 DIAGNOSIS — J849 Interstitial pulmonary disease, unspecified: Secondary | ICD-10-CM | POA: Diagnosis not present

## 2019-01-10 DIAGNOSIS — R918 Other nonspecific abnormal finding of lung field: Secondary | ICD-10-CM | POA: Diagnosis not present

## 2019-01-10 DIAGNOSIS — N6310 Unspecified lump in the right breast, unspecified quadrant: Secondary | ICD-10-CM | POA: Diagnosis not present

## 2019-01-14 DIAGNOSIS — C50912 Malignant neoplasm of unspecified site of left female breast: Secondary | ICD-10-CM | POA: Diagnosis not present

## 2019-01-14 DIAGNOSIS — C7951 Secondary malignant neoplasm of bone: Secondary | ICD-10-CM | POA: Diagnosis not present

## 2019-01-16 DIAGNOSIS — Z79899 Other long term (current) drug therapy: Secondary | ICD-10-CM | POA: Diagnosis not present

## 2019-01-16 DIAGNOSIS — G629 Polyneuropathy, unspecified: Secondary | ICD-10-CM | POA: Diagnosis not present

## 2019-01-16 DIAGNOSIS — C7951 Secondary malignant neoplasm of bone: Secondary | ICD-10-CM | POA: Diagnosis not present

## 2019-01-16 DIAGNOSIS — R918 Other nonspecific abnormal finding of lung field: Secondary | ICD-10-CM | POA: Diagnosis not present

## 2019-01-16 DIAGNOSIS — C50912 Malignant neoplasm of unspecified site of left female breast: Secondary | ICD-10-CM | POA: Diagnosis not present

## 2019-01-16 DIAGNOSIS — E871 Hypo-osmolality and hyponatremia: Secondary | ICD-10-CM | POA: Diagnosis not present

## 2019-01-16 DIAGNOSIS — C770 Secondary and unspecified malignant neoplasm of lymph nodes of head, face and neck: Secondary | ICD-10-CM | POA: Diagnosis not present

## 2019-01-16 DIAGNOSIS — E860 Dehydration: Secondary | ICD-10-CM | POA: Diagnosis not present

## 2019-01-16 DIAGNOSIS — G893 Neoplasm related pain (acute) (chronic): Secondary | ICD-10-CM | POA: Diagnosis not present

## 2019-01-16 DIAGNOSIS — Z006 Encounter for examination for normal comparison and control in clinical research program: Secondary | ICD-10-CM | POA: Diagnosis not present

## 2019-01-16 DIAGNOSIS — Z170 Estrogen receptor positive status [ER+]: Secondary | ICD-10-CM | POA: Diagnosis not present

## 2019-01-16 DIAGNOSIS — B029 Zoster without complications: Secondary | ICD-10-CM | POA: Diagnosis not present

## 2019-01-16 DIAGNOSIS — M8448XK Pathological fracture, other site, subsequent encounter for fracture with nonunion: Secondary | ICD-10-CM | POA: Diagnosis not present

## 2019-01-16 DIAGNOSIS — Z5112 Encounter for antineoplastic immunotherapy: Secondary | ICD-10-CM | POA: Diagnosis not present

## 2019-01-16 DIAGNOSIS — N644 Mastodynia: Secondary | ICD-10-CM | POA: Diagnosis not present

## 2019-01-17 DIAGNOSIS — C50912 Malignant neoplasm of unspecified site of left female breast: Secondary | ICD-10-CM | POA: Diagnosis not present

## 2019-02-05 DIAGNOSIS — F4323 Adjustment disorder with mixed anxiety and depressed mood: Secondary | ICD-10-CM | POA: Diagnosis not present

## 2019-02-06 DIAGNOSIS — C770 Secondary and unspecified malignant neoplasm of lymph nodes of head, face and neck: Secondary | ICD-10-CM | POA: Diagnosis not present

## 2019-02-06 DIAGNOSIS — R918 Other nonspecific abnormal finding of lung field: Secondary | ICD-10-CM | POA: Diagnosis not present

## 2019-02-06 DIAGNOSIS — G629 Polyneuropathy, unspecified: Secondary | ICD-10-CM | POA: Diagnosis not present

## 2019-02-06 DIAGNOSIS — Z170 Estrogen receptor positive status [ER+]: Secondary | ICD-10-CM | POA: Diagnosis not present

## 2019-02-06 DIAGNOSIS — Z006 Encounter for examination for normal comparison and control in clinical research program: Secondary | ICD-10-CM | POA: Diagnosis not present

## 2019-02-06 DIAGNOSIS — C7951 Secondary malignant neoplasm of bone: Secondary | ICD-10-CM | POA: Diagnosis not present

## 2019-02-06 DIAGNOSIS — Z9221 Personal history of antineoplastic chemotherapy: Secondary | ICD-10-CM | POA: Diagnosis not present

## 2019-02-06 DIAGNOSIS — Z5112 Encounter for antineoplastic immunotherapy: Secondary | ICD-10-CM | POA: Diagnosis not present

## 2019-02-06 DIAGNOSIS — G893 Neoplasm related pain (acute) (chronic): Secondary | ICD-10-CM | POA: Diagnosis not present

## 2019-02-06 DIAGNOSIS — B029 Zoster without complications: Secondary | ICD-10-CM | POA: Diagnosis not present

## 2019-02-06 DIAGNOSIS — Z923 Personal history of irradiation: Secondary | ICD-10-CM | POA: Diagnosis not present

## 2019-02-06 DIAGNOSIS — T148XXD Other injury of unspecified body region, subsequent encounter: Secondary | ICD-10-CM | POA: Diagnosis not present

## 2019-02-06 DIAGNOSIS — E871 Hypo-osmolality and hyponatremia: Secondary | ICD-10-CM | POA: Diagnosis not present

## 2019-02-06 DIAGNOSIS — C50912 Malignant neoplasm of unspecified site of left female breast: Secondary | ICD-10-CM | POA: Diagnosis not present

## 2019-02-06 DIAGNOSIS — N644 Mastodynia: Secondary | ICD-10-CM | POA: Diagnosis not present

## 2019-02-06 DIAGNOSIS — C413 Malignant neoplasm of ribs, sternum and clavicle: Secondary | ICD-10-CM | POA: Diagnosis not present

## 2019-02-10 DIAGNOSIS — C7951 Secondary malignant neoplasm of bone: Secondary | ICD-10-CM | POA: Diagnosis not present

## 2019-02-10 DIAGNOSIS — C50912 Malignant neoplasm of unspecified site of left female breast: Secondary | ICD-10-CM | POA: Diagnosis not present

## 2019-02-12 DIAGNOSIS — F4323 Adjustment disorder with mixed anxiety and depressed mood: Secondary | ICD-10-CM | POA: Diagnosis not present

## 2019-02-17 DIAGNOSIS — C50912 Malignant neoplasm of unspecified site of left female breast: Secondary | ICD-10-CM | POA: Diagnosis not present

## 2019-02-19 DIAGNOSIS — F4323 Adjustment disorder with mixed anxiety and depressed mood: Secondary | ICD-10-CM | POA: Diagnosis not present

## 2019-02-26 DIAGNOSIS — F4323 Adjustment disorder with mixed anxiety and depressed mood: Secondary | ICD-10-CM | POA: Diagnosis not present

## 2019-02-27 DIAGNOSIS — C50912 Malignant neoplasm of unspecified site of left female breast: Secondary | ICD-10-CM | POA: Diagnosis not present

## 2019-02-27 DIAGNOSIS — C7951 Secondary malignant neoplasm of bone: Secondary | ICD-10-CM | POA: Diagnosis not present

## 2019-02-27 DIAGNOSIS — R0602 Shortness of breath: Secondary | ICD-10-CM | POA: Diagnosis not present

## 2019-02-27 DIAGNOSIS — R918 Other nonspecific abnormal finding of lung field: Secondary | ICD-10-CM | POA: Diagnosis not present

## 2019-03-04 DIAGNOSIS — N6310 Unspecified lump in the right breast, unspecified quadrant: Secondary | ICD-10-CM | POA: Diagnosis not present

## 2019-03-04 DIAGNOSIS — Z9012 Acquired absence of left breast and nipple: Secondary | ICD-10-CM | POA: Diagnosis not present

## 2019-03-04 DIAGNOSIS — C50912 Malignant neoplasm of unspecified site of left female breast: Secondary | ICD-10-CM | POA: Diagnosis not present

## 2019-03-04 DIAGNOSIS — B0223 Postherpetic polyneuropathy: Secondary | ICD-10-CM | POA: Diagnosis not present

## 2019-03-04 DIAGNOSIS — R918 Other nonspecific abnormal finding of lung field: Secondary | ICD-10-CM | POA: Diagnosis not present

## 2019-03-04 DIAGNOSIS — Z853 Personal history of malignant neoplasm of breast: Secondary | ICD-10-CM | POA: Diagnosis not present

## 2019-03-04 DIAGNOSIS — Z5111 Encounter for antineoplastic chemotherapy: Secondary | ICD-10-CM | POA: Diagnosis not present

## 2019-03-04 DIAGNOSIS — C7951 Secondary malignant neoplasm of bone: Secondary | ICD-10-CM | POA: Diagnosis not present

## 2019-03-05 DIAGNOSIS — F4323 Adjustment disorder with mixed anxiety and depressed mood: Secondary | ICD-10-CM | POA: Diagnosis not present

## 2019-03-06 DIAGNOSIS — J45909 Unspecified asthma, uncomplicated: Secondary | ICD-10-CM | POA: Diagnosis not present

## 2019-03-06 DIAGNOSIS — R942 Abnormal results of pulmonary function studies: Secondary | ICD-10-CM | POA: Diagnosis not present

## 2019-03-10 DIAGNOSIS — G893 Neoplasm related pain (acute) (chronic): Secondary | ICD-10-CM | POA: Diagnosis not present

## 2019-03-12 DIAGNOSIS — F4323 Adjustment disorder with mixed anxiety and depressed mood: Secondary | ICD-10-CM | POA: Diagnosis not present

## 2019-03-19 DIAGNOSIS — F4323 Adjustment disorder with mixed anxiety and depressed mood: Secondary | ICD-10-CM | POA: Diagnosis not present

## 2019-03-20 DIAGNOSIS — C50912 Malignant neoplasm of unspecified site of left female breast: Secondary | ICD-10-CM | POA: Diagnosis not present

## 2019-03-20 DIAGNOSIS — C7951 Secondary malignant neoplasm of bone: Secondary | ICD-10-CM | POA: Diagnosis not present

## 2019-03-20 DIAGNOSIS — R0609 Other forms of dyspnea: Secondary | ICD-10-CM | POA: Diagnosis not present

## 2019-03-20 DIAGNOSIS — J4520 Mild intermittent asthma, uncomplicated: Secondary | ICD-10-CM | POA: Diagnosis not present

## 2019-03-20 DIAGNOSIS — R942 Abnormal results of pulmonary function studies: Secondary | ICD-10-CM | POA: Diagnosis not present

## 2019-03-21 DIAGNOSIS — C50912 Malignant neoplasm of unspecified site of left female breast: Secondary | ICD-10-CM | POA: Diagnosis not present

## 2019-03-24 DIAGNOSIS — E039 Hypothyroidism, unspecified: Secondary | ICD-10-CM | POA: Diagnosis not present

## 2019-03-24 DIAGNOSIS — C7951 Secondary malignant neoplasm of bone: Secondary | ICD-10-CM | POA: Diagnosis not present

## 2019-03-24 DIAGNOSIS — Z5111 Encounter for antineoplastic chemotherapy: Secondary | ICD-10-CM | POA: Diagnosis not present

## 2019-03-24 DIAGNOSIS — G5793 Unspecified mononeuropathy of bilateral lower limbs: Secondary | ICD-10-CM | POA: Diagnosis not present

## 2019-03-24 DIAGNOSIS — R918 Other nonspecific abnormal finding of lung field: Secondary | ICD-10-CM | POA: Diagnosis not present

## 2019-03-24 DIAGNOSIS — Z79899 Other long term (current) drug therapy: Secondary | ICD-10-CM | POA: Diagnosis not present

## 2019-03-24 DIAGNOSIS — B0223 Postherpetic polyneuropathy: Secondary | ICD-10-CM | POA: Diagnosis not present

## 2019-03-24 DIAGNOSIS — Z171 Estrogen receptor negative status [ER-]: Secondary | ICD-10-CM | POA: Diagnosis not present

## 2019-03-24 DIAGNOSIS — N644 Mastodynia: Secondary | ICD-10-CM | POA: Diagnosis not present

## 2019-03-24 DIAGNOSIS — C50912 Malignant neoplasm of unspecified site of left female breast: Secondary | ICD-10-CM | POA: Diagnosis not present

## 2019-03-24 DIAGNOSIS — G629 Polyneuropathy, unspecified: Secondary | ICD-10-CM | POA: Diagnosis not present

## 2019-03-24 DIAGNOSIS — B029 Zoster without complications: Secondary | ICD-10-CM | POA: Diagnosis not present

## 2019-03-24 DIAGNOSIS — E871 Hypo-osmolality and hyponatremia: Secondary | ICD-10-CM | POA: Diagnosis not present

## 2019-03-26 DIAGNOSIS — F4323 Adjustment disorder with mixed anxiety and depressed mood: Secondary | ICD-10-CM | POA: Diagnosis not present

## 2019-04-02 DIAGNOSIS — F4323 Adjustment disorder with mixed anxiety and depressed mood: Secondary | ICD-10-CM | POA: Diagnosis not present

## 2019-04-09 DIAGNOSIS — F4323 Adjustment disorder with mixed anxiety and depressed mood: Secondary | ICD-10-CM | POA: Diagnosis not present

## 2019-04-11 DIAGNOSIS — N6310 Unspecified lump in the right breast, unspecified quadrant: Secondary | ICD-10-CM | POA: Diagnosis not present

## 2019-04-11 DIAGNOSIS — C7951 Secondary malignant neoplasm of bone: Secondary | ICD-10-CM | POA: Diagnosis not present

## 2019-04-11 DIAGNOSIS — N6320 Unspecified lump in the left breast, unspecified quadrant: Secondary | ICD-10-CM | POA: Diagnosis not present

## 2019-04-11 DIAGNOSIS — C50912 Malignant neoplasm of unspecified site of left female breast: Secondary | ICD-10-CM | POA: Diagnosis not present

## 2019-04-14 DIAGNOSIS — Z170 Estrogen receptor positive status [ER+]: Secondary | ICD-10-CM | POA: Diagnosis not present

## 2019-04-14 DIAGNOSIS — G629 Polyneuropathy, unspecified: Secondary | ICD-10-CM | POA: Diagnosis not present

## 2019-04-14 DIAGNOSIS — C50912 Malignant neoplasm of unspecified site of left female breast: Secondary | ICD-10-CM | POA: Diagnosis not present

## 2019-04-14 DIAGNOSIS — Z923 Personal history of irradiation: Secondary | ICD-10-CM | POA: Diagnosis not present

## 2019-04-14 DIAGNOSIS — C792 Secondary malignant neoplasm of skin: Secondary | ICD-10-CM | POA: Diagnosis not present

## 2019-04-14 DIAGNOSIS — B029 Zoster without complications: Secondary | ICD-10-CM | POA: Diagnosis not present

## 2019-04-14 DIAGNOSIS — C7951 Secondary malignant neoplasm of bone: Secondary | ICD-10-CM | POA: Diagnosis not present

## 2019-04-14 DIAGNOSIS — Z5111 Encounter for antineoplastic chemotherapy: Secondary | ICD-10-CM | POA: Diagnosis not present

## 2019-04-14 DIAGNOSIS — N644 Mastodynia: Secondary | ICD-10-CM | POA: Diagnosis not present

## 2019-04-14 DIAGNOSIS — E871 Hypo-osmolality and hyponatremia: Secondary | ICD-10-CM | POA: Diagnosis not present

## 2019-04-14 DIAGNOSIS — E039 Hypothyroidism, unspecified: Secondary | ICD-10-CM | POA: Diagnosis not present

## 2019-04-14 DIAGNOSIS — G893 Neoplasm related pain (acute) (chronic): Secondary | ICD-10-CM | POA: Diagnosis not present

## 2019-04-16 DIAGNOSIS — F4323 Adjustment disorder with mixed anxiety and depressed mood: Secondary | ICD-10-CM | POA: Diagnosis not present

## 2019-04-17 DIAGNOSIS — G893 Neoplasm related pain (acute) (chronic): Secondary | ICD-10-CM | POA: Diagnosis not present

## 2019-04-17 DIAGNOSIS — K5903 Drug induced constipation: Secondary | ICD-10-CM | POA: Diagnosis not present

## 2019-04-17 DIAGNOSIS — T402X5A Adverse effect of other opioids, initial encounter: Secondary | ICD-10-CM | POA: Diagnosis not present

## 2019-04-17 DIAGNOSIS — Z515 Encounter for palliative care: Secondary | ICD-10-CM | POA: Diagnosis not present

## 2019-04-21 DIAGNOSIS — C50912 Malignant neoplasm of unspecified site of left female breast: Secondary | ICD-10-CM | POA: Diagnosis not present

## 2019-04-23 DIAGNOSIS — F4323 Adjustment disorder with mixed anxiety and depressed mood: Secondary | ICD-10-CM | POA: Diagnosis not present

## 2019-04-30 DIAGNOSIS — F4323 Adjustment disorder with mixed anxiety and depressed mood: Secondary | ICD-10-CM | POA: Diagnosis not present

## 2019-05-05 DIAGNOSIS — Z5111 Encounter for antineoplastic chemotherapy: Secondary | ICD-10-CM | POA: Diagnosis not present

## 2019-05-05 DIAGNOSIS — C50912 Malignant neoplasm of unspecified site of left female breast: Secondary | ICD-10-CM | POA: Diagnosis not present

## 2019-05-05 DIAGNOSIS — R490 Dysphonia: Secondary | ICD-10-CM | POA: Diagnosis not present

## 2019-05-05 DIAGNOSIS — C7951 Secondary malignant neoplasm of bone: Secondary | ICD-10-CM | POA: Diagnosis not present

## 2019-05-05 DIAGNOSIS — B0223 Postherpetic polyneuropathy: Secondary | ICD-10-CM | POA: Diagnosis not present

## 2019-05-07 DIAGNOSIS — F4323 Adjustment disorder with mixed anxiety and depressed mood: Secondary | ICD-10-CM | POA: Diagnosis not present

## 2019-05-08 DIAGNOSIS — J370 Chronic laryngitis: Secondary | ICD-10-CM | POA: Diagnosis not present

## 2019-05-08 DIAGNOSIS — R490 Dysphonia: Secondary | ICD-10-CM | POA: Diagnosis not present

## 2019-05-08 DIAGNOSIS — B3789 Other sites of candidiasis: Secondary | ICD-10-CM | POA: Diagnosis not present

## 2019-05-08 DIAGNOSIS — J3801 Paralysis of vocal cords and larynx, unilateral: Secondary | ICD-10-CM | POA: Diagnosis not present

## 2019-05-14 DIAGNOSIS — F4323 Adjustment disorder with mixed anxiety and depressed mood: Secondary | ICD-10-CM | POA: Diagnosis not present

## 2019-05-19 DIAGNOSIS — Z01812 Encounter for preprocedural laboratory examination: Secondary | ICD-10-CM | POA: Diagnosis not present

## 2019-05-19 DIAGNOSIS — Z1159 Encounter for screening for other viral diseases: Secondary | ICD-10-CM | POA: Diagnosis not present

## 2019-05-20 DIAGNOSIS — J3801 Paralysis of vocal cords and larynx, unilateral: Secondary | ICD-10-CM | POA: Diagnosis not present

## 2019-05-22 DIAGNOSIS — C50912 Malignant neoplasm of unspecified site of left female breast: Secondary | ICD-10-CM | POA: Diagnosis not present

## 2019-05-23 DIAGNOSIS — R918 Other nonspecific abnormal finding of lung field: Secondary | ICD-10-CM | POA: Diagnosis not present

## 2019-05-23 DIAGNOSIS — C7951 Secondary malignant neoplasm of bone: Secondary | ICD-10-CM | POA: Diagnosis not present

## 2019-05-23 DIAGNOSIS — C50919 Malignant neoplasm of unspecified site of unspecified female breast: Secondary | ICD-10-CM | POA: Diagnosis not present

## 2019-05-26 DIAGNOSIS — I890 Lymphedema, not elsewhere classified: Secondary | ICD-10-CM | POA: Diagnosis not present

## 2019-05-26 DIAGNOSIS — R0789 Other chest pain: Secondary | ICD-10-CM | POA: Diagnosis not present

## 2019-05-26 DIAGNOSIS — C7951 Secondary malignant neoplasm of bone: Secondary | ICD-10-CM | POA: Diagnosis not present

## 2019-05-26 DIAGNOSIS — C50912 Malignant neoplasm of unspecified site of left female breast: Secondary | ICD-10-CM | POA: Diagnosis not present

## 2019-05-26 DIAGNOSIS — Z5112 Encounter for antineoplastic immunotherapy: Secondary | ICD-10-CM | POA: Diagnosis not present

## 2019-05-26 DIAGNOSIS — G893 Neoplasm related pain (acute) (chronic): Secondary | ICD-10-CM | POA: Diagnosis not present

## 2019-05-28 DIAGNOSIS — F4323 Adjustment disorder with mixed anxiety and depressed mood: Secondary | ICD-10-CM | POA: Diagnosis not present

## 2019-06-04 DIAGNOSIS — F4323 Adjustment disorder with mixed anxiety and depressed mood: Secondary | ICD-10-CM | POA: Diagnosis not present

## 2019-06-10 DIAGNOSIS — R490 Dysphonia: Secondary | ICD-10-CM | POA: Diagnosis not present

## 2019-06-10 DIAGNOSIS — J3801 Paralysis of vocal cords and larynx, unilateral: Secondary | ICD-10-CM | POA: Diagnosis not present

## 2019-06-11 DIAGNOSIS — F4323 Adjustment disorder with mixed anxiety and depressed mood: Secondary | ICD-10-CM | POA: Diagnosis not present

## 2019-06-16 DIAGNOSIS — D709 Neutropenia, unspecified: Secondary | ICD-10-CM | POA: Diagnosis not present

## 2019-06-16 DIAGNOSIS — Z79899 Other long term (current) drug therapy: Secondary | ICD-10-CM | POA: Diagnosis not present

## 2019-06-16 DIAGNOSIS — I890 Lymphedema, not elsewhere classified: Secondary | ICD-10-CM | POA: Diagnosis not present

## 2019-06-16 DIAGNOSIS — Z79811 Long term (current) use of aromatase inhibitors: Secondary | ICD-10-CM | POA: Diagnosis not present

## 2019-06-16 DIAGNOSIS — Z171 Estrogen receptor negative status [ER-]: Secondary | ICD-10-CM | POA: Diagnosis not present

## 2019-06-16 DIAGNOSIS — E871 Hypo-osmolality and hyponatremia: Secondary | ICD-10-CM | POA: Diagnosis not present

## 2019-06-16 DIAGNOSIS — R918 Other nonspecific abnormal finding of lung field: Secondary | ICD-10-CM | POA: Diagnosis not present

## 2019-06-16 DIAGNOSIS — C7951 Secondary malignant neoplasm of bone: Secondary | ICD-10-CM | POA: Diagnosis not present

## 2019-06-16 DIAGNOSIS — E039 Hypothyroidism, unspecified: Secondary | ICD-10-CM | POA: Diagnosis not present

## 2019-06-16 DIAGNOSIS — Z5112 Encounter for antineoplastic immunotherapy: Secondary | ICD-10-CM | POA: Diagnosis not present

## 2019-06-16 DIAGNOSIS — Z9012 Acquired absence of left breast and nipple: Secondary | ICD-10-CM | POA: Diagnosis not present

## 2019-06-16 DIAGNOSIS — G629 Polyneuropathy, unspecified: Secondary | ICD-10-CM | POA: Diagnosis not present

## 2019-06-16 DIAGNOSIS — G893 Neoplasm related pain (acute) (chronic): Secondary | ICD-10-CM | POA: Diagnosis not present

## 2019-06-16 DIAGNOSIS — C50912 Malignant neoplasm of unspecified site of left female breast: Secondary | ICD-10-CM | POA: Diagnosis not present

## 2019-06-16 DIAGNOSIS — M8458XD Pathological fracture in neoplastic disease, other specified site, subsequent encounter for fracture with routine healing: Secondary | ICD-10-CM | POA: Diagnosis not present

## 2019-06-16 DIAGNOSIS — B029 Zoster without complications: Secondary | ICD-10-CM | POA: Diagnosis not present

## 2019-06-16 DIAGNOSIS — Z923 Personal history of irradiation: Secondary | ICD-10-CM | POA: Diagnosis not present

## 2019-06-18 DIAGNOSIS — F4323 Adjustment disorder with mixed anxiety and depressed mood: Secondary | ICD-10-CM | POA: Diagnosis not present

## 2019-06-25 DIAGNOSIS — F4323 Adjustment disorder with mixed anxiety and depressed mood: Secondary | ICD-10-CM | POA: Diagnosis not present

## 2019-07-02 DIAGNOSIS — F4323 Adjustment disorder with mixed anxiety and depressed mood: Secondary | ICD-10-CM | POA: Diagnosis not present

## 2019-07-04 DIAGNOSIS — C50919 Malignant neoplasm of unspecified site of unspecified female breast: Secondary | ICD-10-CM | POA: Diagnosis not present

## 2019-07-04 DIAGNOSIS — R0600 Dyspnea, unspecified: Secondary | ICD-10-CM | POA: Diagnosis not present

## 2019-07-04 DIAGNOSIS — C7951 Secondary malignant neoplasm of bone: Secondary | ICD-10-CM | POA: Diagnosis not present

## 2019-07-04 DIAGNOSIS — R918 Other nonspecific abnormal finding of lung field: Secondary | ICD-10-CM | POA: Diagnosis not present

## 2019-07-07 DIAGNOSIS — C50912 Malignant neoplasm of unspecified site of left female breast: Secondary | ICD-10-CM | POA: Diagnosis not present

## 2019-07-07 DIAGNOSIS — C7951 Secondary malignant neoplasm of bone: Secondary | ICD-10-CM | POA: Diagnosis not present

## 2019-07-07 DIAGNOSIS — Z5112 Encounter for antineoplastic immunotherapy: Secondary | ICD-10-CM | POA: Diagnosis not present

## 2019-07-09 DIAGNOSIS — F4323 Adjustment disorder with mixed anxiety and depressed mood: Secondary | ICD-10-CM | POA: Diagnosis not present

## 2019-07-16 DIAGNOSIS — F4323 Adjustment disorder with mixed anxiety and depressed mood: Secondary | ICD-10-CM | POA: Diagnosis not present

## 2019-07-28 DIAGNOSIS — Z5112 Encounter for antineoplastic immunotherapy: Secondary | ICD-10-CM | POA: Diagnosis not present

## 2019-07-28 DIAGNOSIS — I890 Lymphedema, not elsewhere classified: Secondary | ICD-10-CM | POA: Diagnosis not present

## 2019-07-28 DIAGNOSIS — B0223 Postherpetic polyneuropathy: Secondary | ICD-10-CM | POA: Diagnosis not present

## 2019-07-28 DIAGNOSIS — C50912 Malignant neoplasm of unspecified site of left female breast: Secondary | ICD-10-CM | POA: Diagnosis not present

## 2019-07-28 DIAGNOSIS — C7951 Secondary malignant neoplasm of bone: Secondary | ICD-10-CM | POA: Diagnosis not present

## 2019-07-28 DIAGNOSIS — G893 Neoplasm related pain (acute) (chronic): Secondary | ICD-10-CM | POA: Diagnosis not present

## 2019-07-28 DIAGNOSIS — G5793 Unspecified mononeuropathy of bilateral lower limbs: Secondary | ICD-10-CM | POA: Diagnosis not present

## 2019-07-28 DIAGNOSIS — J849 Interstitial pulmonary disease, unspecified: Secondary | ICD-10-CM | POA: Diagnosis not present

## 2019-08-05 DIAGNOSIS — J370 Chronic laryngitis: Secondary | ICD-10-CM | POA: Diagnosis not present

## 2019-08-05 DIAGNOSIS — R490 Dysphonia: Secondary | ICD-10-CM | POA: Diagnosis not present

## 2019-08-05 DIAGNOSIS — J3801 Paralysis of vocal cords and larynx, unilateral: Secondary | ICD-10-CM | POA: Diagnosis not present

## 2019-08-06 DIAGNOSIS — F4323 Adjustment disorder with mixed anxiety and depressed mood: Secondary | ICD-10-CM | POA: Diagnosis not present

## 2019-08-13 DIAGNOSIS — F4323 Adjustment disorder with mixed anxiety and depressed mood: Secondary | ICD-10-CM | POA: Diagnosis not present

## 2019-08-15 DIAGNOSIS — K5903 Drug induced constipation: Secondary | ICD-10-CM | POA: Diagnosis not present

## 2019-08-15 DIAGNOSIS — R112 Nausea with vomiting, unspecified: Secondary | ICD-10-CM | POA: Diagnosis not present

## 2019-08-15 DIAGNOSIS — F064 Anxiety disorder due to known physiological condition: Secondary | ICD-10-CM | POA: Diagnosis not present

## 2019-08-15 DIAGNOSIS — G893 Neoplasm related pain (acute) (chronic): Secondary | ICD-10-CM | POA: Diagnosis not present

## 2019-08-19 DIAGNOSIS — J849 Interstitial pulmonary disease, unspecified: Secondary | ICD-10-CM | POA: Diagnosis not present

## 2019-08-19 DIAGNOSIS — R918 Other nonspecific abnormal finding of lung field: Secondary | ICD-10-CM | POA: Diagnosis not present

## 2019-08-20 DIAGNOSIS — F4323 Adjustment disorder with mixed anxiety and depressed mood: Secondary | ICD-10-CM | POA: Diagnosis not present

## 2019-08-21 DIAGNOSIS — Z923 Personal history of irradiation: Secondary | ICD-10-CM | POA: Diagnosis not present

## 2019-08-21 DIAGNOSIS — Z9012 Acquired absence of left breast and nipple: Secondary | ICD-10-CM | POA: Diagnosis not present

## 2019-08-21 DIAGNOSIS — R0789 Other chest pain: Secondary | ICD-10-CM | POA: Diagnosis not present

## 2019-08-21 DIAGNOSIS — Z803 Family history of malignant neoplasm of breast: Secondary | ICD-10-CM | POA: Diagnosis not present

## 2019-08-21 DIAGNOSIS — C792 Secondary malignant neoplasm of skin: Secondary | ICD-10-CM | POA: Diagnosis not present

## 2019-08-21 DIAGNOSIS — E039 Hypothyroidism, unspecified: Secondary | ICD-10-CM | POA: Diagnosis not present

## 2019-08-21 DIAGNOSIS — G5793 Unspecified mononeuropathy of bilateral lower limbs: Secondary | ICD-10-CM | POA: Diagnosis not present

## 2019-08-21 DIAGNOSIS — Z807 Family history of other malignant neoplasms of lymphoid, hematopoietic and related tissues: Secondary | ICD-10-CM | POA: Diagnosis not present

## 2019-08-21 DIAGNOSIS — G893 Neoplasm related pain (acute) (chronic): Secondary | ICD-10-CM | POA: Diagnosis not present

## 2019-08-21 DIAGNOSIS — B029 Zoster without complications: Secondary | ICD-10-CM | POA: Diagnosis not present

## 2019-08-21 DIAGNOSIS — C50912 Malignant neoplasm of unspecified site of left female breast: Secondary | ICD-10-CM | POA: Diagnosis not present

## 2019-08-21 DIAGNOSIS — R918 Other nonspecific abnormal finding of lung field: Secondary | ICD-10-CM | POA: Diagnosis not present

## 2019-08-21 DIAGNOSIS — Z171 Estrogen receptor negative status [ER-]: Secondary | ICD-10-CM | POA: Diagnosis not present

## 2019-08-21 DIAGNOSIS — M8458XD Pathological fracture in neoplastic disease, other specified site, subsequent encounter for fracture with routine healing: Secondary | ICD-10-CM | POA: Diagnosis not present

## 2019-08-21 DIAGNOSIS — B0223 Postherpetic polyneuropathy: Secondary | ICD-10-CM | POA: Diagnosis not present

## 2019-08-21 DIAGNOSIS — C7951 Secondary malignant neoplasm of bone: Secondary | ICD-10-CM | POA: Diagnosis not present

## 2019-08-21 DIAGNOSIS — G629 Polyneuropathy, unspecified: Secondary | ICD-10-CM | POA: Diagnosis not present

## 2019-08-21 DIAGNOSIS — Z5112 Encounter for antineoplastic immunotherapy: Secondary | ICD-10-CM | POA: Diagnosis not present

## 2019-08-22 DIAGNOSIS — R490 Dysphonia: Secondary | ICD-10-CM | POA: Diagnosis not present

## 2019-08-22 DIAGNOSIS — J3801 Paralysis of vocal cords and larynx, unilateral: Secondary | ICD-10-CM | POA: Diagnosis not present

## 2019-08-26 DIAGNOSIS — C50919 Malignant neoplasm of unspecified site of unspecified female breast: Secondary | ICD-10-CM | POA: Diagnosis not present

## 2019-08-26 DIAGNOSIS — C7951 Secondary malignant neoplasm of bone: Secondary | ICD-10-CM | POA: Diagnosis not present

## 2019-08-26 DIAGNOSIS — C50912 Malignant neoplasm of unspecified site of left female breast: Secondary | ICD-10-CM | POA: Diagnosis not present

## 2019-08-26 DIAGNOSIS — R51 Headache: Secondary | ICD-10-CM | POA: Diagnosis not present

## 2019-08-26 DIAGNOSIS — M25511 Pain in right shoulder: Secondary | ICD-10-CM | POA: Diagnosis not present

## 2019-08-27 DIAGNOSIS — F4323 Adjustment disorder with mixed anxiety and depressed mood: Secondary | ICD-10-CM | POA: Diagnosis not present

## 2019-08-28 DIAGNOSIS — R490 Dysphonia: Secondary | ICD-10-CM | POA: Diagnosis not present

## 2019-08-28 DIAGNOSIS — J3801 Paralysis of vocal cords and larynx, unilateral: Secondary | ICD-10-CM | POA: Diagnosis not present

## 2019-09-03 DIAGNOSIS — F4323 Adjustment disorder with mixed anxiety and depressed mood: Secondary | ICD-10-CM | POA: Diagnosis not present

## 2019-09-05 DIAGNOSIS — C7989 Secondary malignant neoplasm of other specified sites: Secondary | ICD-10-CM | POA: Diagnosis not present

## 2019-09-10 DIAGNOSIS — C7989 Secondary malignant neoplasm of other specified sites: Secondary | ICD-10-CM | POA: Diagnosis not present

## 2019-09-11 DIAGNOSIS — C7989 Secondary malignant neoplasm of other specified sites: Secondary | ICD-10-CM | POA: Diagnosis not present

## 2019-09-12 DIAGNOSIS — C7989 Secondary malignant neoplasm of other specified sites: Secondary | ICD-10-CM | POA: Diagnosis not present

## 2019-09-15 DIAGNOSIS — C7989 Secondary malignant neoplasm of other specified sites: Secondary | ICD-10-CM | POA: Diagnosis not present

## 2019-09-16 DIAGNOSIS — C7989 Secondary malignant neoplasm of other specified sites: Secondary | ICD-10-CM | POA: Diagnosis not present

## 2019-09-16 DIAGNOSIS — C50912 Malignant neoplasm of unspecified site of left female breast: Secondary | ICD-10-CM | POA: Diagnosis not present

## 2019-09-16 DIAGNOSIS — B0223 Postherpetic polyneuropathy: Secondary | ICD-10-CM | POA: Diagnosis not present

## 2019-09-16 DIAGNOSIS — M25511 Pain in right shoulder: Secondary | ICD-10-CM | POA: Diagnosis not present

## 2019-09-16 DIAGNOSIS — G893 Neoplasm related pain (acute) (chronic): Secondary | ICD-10-CM | POA: Diagnosis not present

## 2019-09-16 DIAGNOSIS — C7951 Secondary malignant neoplasm of bone: Secondary | ICD-10-CM | POA: Diagnosis not present

## 2019-09-16 DIAGNOSIS — I890 Lymphedema, not elsewhere classified: Secondary | ICD-10-CM | POA: Diagnosis not present

## 2019-09-16 DIAGNOSIS — T451X5A Adverse effect of antineoplastic and immunosuppressive drugs, initial encounter: Secondary | ICD-10-CM | POA: Diagnosis not present

## 2019-09-16 DIAGNOSIS — G5793 Unspecified mononeuropathy of bilateral lower limbs: Secondary | ICD-10-CM | POA: Diagnosis not present

## 2019-09-16 DIAGNOSIS — Z23 Encounter for immunization: Secondary | ICD-10-CM | POA: Diagnosis not present

## 2019-09-16 DIAGNOSIS — M792 Neuralgia and neuritis, unspecified: Secondary | ICD-10-CM | POA: Diagnosis not present

## 2019-09-16 DIAGNOSIS — R112 Nausea with vomiting, unspecified: Secondary | ICD-10-CM | POA: Diagnosis not present

## 2019-09-16 DIAGNOSIS — M7989 Other specified soft tissue disorders: Secondary | ICD-10-CM | POA: Diagnosis not present

## 2019-09-17 DIAGNOSIS — Z510 Encounter for antineoplastic radiation therapy: Secondary | ICD-10-CM | POA: Diagnosis not present

## 2019-09-17 DIAGNOSIS — R221 Localized swelling, mass and lump, neck: Secondary | ICD-10-CM | POA: Diagnosis not present

## 2019-09-17 DIAGNOSIS — C50912 Malignant neoplasm of unspecified site of left female breast: Secondary | ICD-10-CM | POA: Diagnosis not present

## 2019-09-17 DIAGNOSIS — C7989 Secondary malignant neoplasm of other specified sites: Secondary | ICD-10-CM | POA: Diagnosis not present

## 2019-09-17 DIAGNOSIS — R222 Localized swelling, mass and lump, trunk: Secondary | ICD-10-CM | POA: Diagnosis not present

## 2019-09-17 DIAGNOSIS — C7951 Secondary malignant neoplasm of bone: Secondary | ICD-10-CM | POA: Diagnosis not present

## 2019-09-17 DIAGNOSIS — Z170 Estrogen receptor positive status [ER+]: Secondary | ICD-10-CM | POA: Diagnosis not present

## 2019-09-23 DIAGNOSIS — C792 Secondary malignant neoplasm of skin: Secondary | ICD-10-CM | POA: Diagnosis not present

## 2019-09-24 DIAGNOSIS — C7989 Secondary malignant neoplasm of other specified sites: Secondary | ICD-10-CM | POA: Diagnosis not present

## 2019-09-24 DIAGNOSIS — R222 Localized swelling, mass and lump, trunk: Secondary | ICD-10-CM | POA: Diagnosis not present

## 2019-09-24 DIAGNOSIS — C50919 Malignant neoplasm of unspecified site of unspecified female breast: Secondary | ICD-10-CM | POA: Diagnosis not present

## 2019-09-24 DIAGNOSIS — C801 Malignant (primary) neoplasm, unspecified: Secondary | ICD-10-CM | POA: Diagnosis not present

## 2019-09-24 DIAGNOSIS — C7951 Secondary malignant neoplasm of bone: Secondary | ICD-10-CM | POA: Diagnosis not present

## 2019-09-24 DIAGNOSIS — C50912 Malignant neoplasm of unspecified site of left female breast: Secondary | ICD-10-CM | POA: Diagnosis not present

## 2019-09-24 DIAGNOSIS — F4323 Adjustment disorder with mixed anxiety and depressed mood: Secondary | ICD-10-CM | POA: Diagnosis not present

## 2019-09-25 DIAGNOSIS — Z5112 Encounter for antineoplastic immunotherapy: Secondary | ICD-10-CM | POA: Diagnosis not present

## 2019-09-25 DIAGNOSIS — G893 Neoplasm related pain (acute) (chronic): Secondary | ICD-10-CM | POA: Diagnosis not present

## 2019-09-25 DIAGNOSIS — R918 Other nonspecific abnormal finding of lung field: Secondary | ICD-10-CM | POA: Diagnosis not present

## 2019-09-25 DIAGNOSIS — M792 Neuralgia and neuritis, unspecified: Secondary | ICD-10-CM | POA: Diagnosis not present

## 2019-09-25 DIAGNOSIS — C792 Secondary malignant neoplasm of skin: Secondary | ICD-10-CM | POA: Diagnosis not present

## 2019-09-25 DIAGNOSIS — C50912 Malignant neoplasm of unspecified site of left female breast: Secondary | ICD-10-CM | POA: Diagnosis not present

## 2019-09-25 DIAGNOSIS — R600 Localized edema: Secondary | ICD-10-CM | POA: Diagnosis not present

## 2019-09-25 DIAGNOSIS — R222 Localized swelling, mass and lump, trunk: Secondary | ICD-10-CM | POA: Diagnosis not present

## 2019-09-25 DIAGNOSIS — C7989 Secondary malignant neoplasm of other specified sites: Secondary | ICD-10-CM | POA: Diagnosis not present

## 2019-09-25 DIAGNOSIS — G629 Polyneuropathy, unspecified: Secondary | ICD-10-CM | POA: Diagnosis not present

## 2019-09-25 DIAGNOSIS — B029 Zoster without complications: Secondary | ICD-10-CM | POA: Diagnosis not present

## 2019-09-25 DIAGNOSIS — E039 Hypothyroidism, unspecified: Secondary | ICD-10-CM | POA: Diagnosis not present

## 2019-09-25 DIAGNOSIS — R0789 Other chest pain: Secondary | ICD-10-CM | POA: Diagnosis not present

## 2019-09-25 DIAGNOSIS — M7989 Other specified soft tissue disorders: Secondary | ICD-10-CM | POA: Diagnosis not present

## 2019-09-25 DIAGNOSIS — C7951 Secondary malignant neoplasm of bone: Secondary | ICD-10-CM | POA: Diagnosis not present

## 2019-09-25 DIAGNOSIS — Z923 Personal history of irradiation: Secondary | ICD-10-CM | POA: Diagnosis not present

## 2019-09-25 DIAGNOSIS — Z853 Personal history of malignant neoplasm of breast: Secondary | ICD-10-CM | POA: Diagnosis not present

## 2019-09-25 DIAGNOSIS — M25511 Pain in right shoulder: Secondary | ICD-10-CM | POA: Diagnosis not present

## 2019-09-29 DIAGNOSIS — C792 Secondary malignant neoplasm of skin: Secondary | ICD-10-CM | POA: Diagnosis not present

## 2019-09-30 DIAGNOSIS — C7989 Secondary malignant neoplasm of other specified sites: Secondary | ICD-10-CM | POA: Diagnosis not present

## 2019-09-30 DIAGNOSIS — C7951 Secondary malignant neoplasm of bone: Secondary | ICD-10-CM | POA: Diagnosis not present

## 2019-09-30 DIAGNOSIS — C792 Secondary malignant neoplasm of skin: Secondary | ICD-10-CM | POA: Diagnosis not present

## 2019-09-30 DIAGNOSIS — C50912 Malignant neoplasm of unspecified site of left female breast: Secondary | ICD-10-CM | POA: Diagnosis not present

## 2019-10-01 DIAGNOSIS — C792 Secondary malignant neoplasm of skin: Secondary | ICD-10-CM | POA: Diagnosis not present

## 2019-10-02 DIAGNOSIS — C792 Secondary malignant neoplasm of skin: Secondary | ICD-10-CM | POA: Diagnosis not present

## 2019-10-08 DIAGNOSIS — F4323 Adjustment disorder with mixed anxiety and depressed mood: Secondary | ICD-10-CM | POA: Diagnosis not present

## 2019-10-09 DIAGNOSIS — J3802 Paralysis of vocal cords and larynx, bilateral: Secondary | ICD-10-CM | POA: Diagnosis not present

## 2019-10-09 DIAGNOSIS — R490 Dysphonia: Secondary | ICD-10-CM | POA: Diagnosis not present

## 2019-10-09 DIAGNOSIS — C770 Secondary and unspecified malignant neoplasm of lymph nodes of head, face and neck: Secondary | ICD-10-CM | POA: Diagnosis not present

## 2019-10-09 DIAGNOSIS — R1312 Dysphagia, oropharyngeal phase: Secondary | ICD-10-CM | POA: Diagnosis not present

## 2019-10-10 DIAGNOSIS — C770 Secondary and unspecified malignant neoplasm of lymph nodes of head, face and neck: Secondary | ICD-10-CM | POA: Diagnosis not present

## 2019-10-10 DIAGNOSIS — R1312 Dysphagia, oropharyngeal phase: Secondary | ICD-10-CM | POA: Diagnosis not present

## 2019-10-10 DIAGNOSIS — J3802 Paralysis of vocal cords and larynx, bilateral: Secondary | ICD-10-CM | POA: Diagnosis not present

## 2019-10-15 DIAGNOSIS — F4323 Adjustment disorder with mixed anxiety and depressed mood: Secondary | ICD-10-CM | POA: Diagnosis not present

## 2019-10-16 DIAGNOSIS — C7951 Secondary malignant neoplasm of bone: Secondary | ICD-10-CM | POA: Diagnosis not present

## 2019-10-16 DIAGNOSIS — R222 Localized swelling, mass and lump, trunk: Secondary | ICD-10-CM | POA: Diagnosis not present

## 2019-10-16 DIAGNOSIS — M792 Neuralgia and neuritis, unspecified: Secondary | ICD-10-CM | POA: Diagnosis not present

## 2019-10-16 DIAGNOSIS — Z923 Personal history of irradiation: Secondary | ICD-10-CM | POA: Diagnosis not present

## 2019-10-16 DIAGNOSIS — G893 Neoplasm related pain (acute) (chronic): Secondary | ICD-10-CM | POA: Diagnosis not present

## 2019-10-16 DIAGNOSIS — R220 Localized swelling, mass and lump, head: Secondary | ICD-10-CM | POA: Diagnosis not present

## 2019-10-16 DIAGNOSIS — R918 Other nonspecific abnormal finding of lung field: Secondary | ICD-10-CM | POA: Diagnosis not present

## 2019-10-16 DIAGNOSIS — R221 Localized swelling, mass and lump, neck: Secondary | ICD-10-CM | POA: Diagnosis not present

## 2019-10-16 DIAGNOSIS — C50912 Malignant neoplasm of unspecified site of left female breast: Secondary | ICD-10-CM | POA: Diagnosis not present

## 2019-10-16 DIAGNOSIS — J3800 Paralysis of vocal cords and larynx, unspecified: Secondary | ICD-10-CM | POA: Diagnosis not present

## 2019-10-16 DIAGNOSIS — Z171 Estrogen receptor negative status [ER-]: Secondary | ICD-10-CM | POA: Diagnosis not present

## 2019-10-16 DIAGNOSIS — R0602 Shortness of breath: Secondary | ICD-10-CM | POA: Diagnosis not present

## 2019-10-16 DIAGNOSIS — C792 Secondary malignant neoplasm of skin: Secondary | ICD-10-CM | POA: Diagnosis not present

## 2019-10-16 DIAGNOSIS — R229 Localized swelling, mass and lump, unspecified: Secondary | ICD-10-CM | POA: Diagnosis not present

## 2019-10-16 DIAGNOSIS — M25511 Pain in right shoulder: Secondary | ICD-10-CM | POA: Diagnosis not present

## 2019-10-16 DIAGNOSIS — B029 Zoster without complications: Secondary | ICD-10-CM | POA: Diagnosis not present

## 2019-10-16 DIAGNOSIS — G629 Polyneuropathy, unspecified: Secondary | ICD-10-CM | POA: Diagnosis not present

## 2019-10-16 DIAGNOSIS — R1310 Dysphagia, unspecified: Secondary | ICD-10-CM | POA: Diagnosis not present

## 2019-10-22 DIAGNOSIS — F4323 Adjustment disorder with mixed anxiety and depressed mood: Secondary | ICD-10-CM | POA: Diagnosis not present

## 2019-10-23 DIAGNOSIS — I708 Atherosclerosis of other arteries: Secondary | ICD-10-CM | POA: Diagnosis not present

## 2019-10-23 DIAGNOSIS — C7951 Secondary malignant neoplasm of bone: Secondary | ICD-10-CM | POA: Diagnosis not present

## 2019-10-23 DIAGNOSIS — M7989 Other specified soft tissue disorders: Secondary | ICD-10-CM | POA: Diagnosis not present

## 2019-10-23 DIAGNOSIS — J3802 Paralysis of vocal cords and larynx, bilateral: Secondary | ICD-10-CM | POA: Diagnosis not present

## 2019-10-23 DIAGNOSIS — C50912 Malignant neoplasm of unspecified site of left female breast: Secondary | ICD-10-CM | POA: Diagnosis not present

## 2019-10-23 DIAGNOSIS — R918 Other nonspecific abnormal finding of lung field: Secondary | ICD-10-CM | POA: Diagnosis not present

## 2019-10-24 DIAGNOSIS — C50912 Malignant neoplasm of unspecified site of left female breast: Secondary | ICD-10-CM | POA: Diagnosis not present

## 2019-10-24 DIAGNOSIS — C7951 Secondary malignant neoplasm of bone: Secondary | ICD-10-CM | POA: Diagnosis not present

## 2019-11-05 DIAGNOSIS — F4323 Adjustment disorder with mixed anxiety and depressed mood: Secondary | ICD-10-CM | POA: Diagnosis not present

## 2019-11-06 DIAGNOSIS — C7951 Secondary malignant neoplasm of bone: Secondary | ICD-10-CM | POA: Diagnosis not present

## 2019-11-06 DIAGNOSIS — C50912 Malignant neoplasm of unspecified site of left female breast: Secondary | ICD-10-CM | POA: Diagnosis not present

## 2019-11-06 DIAGNOSIS — M792 Neuralgia and neuritis, unspecified: Secondary | ICD-10-CM | POA: Diagnosis not present

## 2019-11-06 DIAGNOSIS — J849 Interstitial pulmonary disease, unspecified: Secondary | ICD-10-CM | POA: Diagnosis not present

## 2019-11-11 DIAGNOSIS — J984 Other disorders of lung: Secondary | ICD-10-CM | POA: Diagnosis not present

## 2019-11-11 DIAGNOSIS — C50912 Malignant neoplasm of unspecified site of left female breast: Secondary | ICD-10-CM | POA: Diagnosis not present

## 2019-11-11 DIAGNOSIS — Z171 Estrogen receptor negative status [ER-]: Secondary | ICD-10-CM | POA: Diagnosis not present

## 2019-11-11 DIAGNOSIS — R918 Other nonspecific abnormal finding of lung field: Secondary | ICD-10-CM | POA: Diagnosis not present

## 2019-11-11 DIAGNOSIS — C792 Secondary malignant neoplasm of skin: Secondary | ICD-10-CM | POA: Diagnosis not present

## 2019-11-11 DIAGNOSIS — R0600 Dyspnea, unspecified: Secondary | ICD-10-CM | POA: Diagnosis not present

## 2019-11-11 DIAGNOSIS — R222 Localized swelling, mass and lump, trunk: Secondary | ICD-10-CM | POA: Diagnosis not present

## 2019-11-11 DIAGNOSIS — G893 Neoplasm related pain (acute) (chronic): Secondary | ICD-10-CM | POA: Diagnosis not present

## 2019-11-11 DIAGNOSIS — M25511 Pain in right shoulder: Secondary | ICD-10-CM | POA: Diagnosis not present

## 2019-11-11 DIAGNOSIS — R9431 Abnormal electrocardiogram [ECG] [EKG]: Secondary | ICD-10-CM | POA: Diagnosis not present

## 2019-11-11 DIAGNOSIS — J849 Interstitial pulmonary disease, unspecified: Secondary | ICD-10-CM | POA: Diagnosis not present

## 2019-11-11 DIAGNOSIS — C7951 Secondary malignant neoplasm of bone: Secondary | ICD-10-CM | POA: Diagnosis not present

## 2019-11-11 DIAGNOSIS — I890 Lymphedema, not elsewhere classified: Secondary | ICD-10-CM | POA: Diagnosis not present

## 2019-11-14 DIAGNOSIS — I890 Lymphedema, not elsewhere classified: Secondary | ICD-10-CM | POA: Diagnosis not present

## 2019-11-14 DIAGNOSIS — G893 Neoplasm related pain (acute) (chronic): Secondary | ICD-10-CM | POA: Diagnosis not present

## 2019-11-14 DIAGNOSIS — C50912 Malignant neoplasm of unspecified site of left female breast: Secondary | ICD-10-CM | POA: Diagnosis not present

## 2019-11-14 DIAGNOSIS — C7951 Secondary malignant neoplasm of bone: Secondary | ICD-10-CM | POA: Diagnosis not present

## 2019-11-17 DIAGNOSIS — G894 Chronic pain syndrome: Secondary | ICD-10-CM | POA: Diagnosis not present

## 2019-11-17 DIAGNOSIS — C50912 Malignant neoplasm of unspecified site of left female breast: Secondary | ICD-10-CM | POA: Diagnosis not present

## 2019-11-17 DIAGNOSIS — M25511 Pain in right shoulder: Secondary | ICD-10-CM | POA: Diagnosis not present

## 2019-11-17 DIAGNOSIS — G893 Neoplasm related pain (acute) (chronic): Secondary | ICD-10-CM | POA: Diagnosis not present

## 2019-11-18 DIAGNOSIS — Z923 Personal history of irradiation: Secondary | ICD-10-CM | POA: Diagnosis not present

## 2019-11-18 DIAGNOSIS — J45909 Unspecified asthma, uncomplicated: Secondary | ICD-10-CM | POA: Diagnosis not present

## 2019-11-18 DIAGNOSIS — F329 Major depressive disorder, single episode, unspecified: Secondary | ICD-10-CM | POA: Diagnosis not present

## 2019-11-18 DIAGNOSIS — I890 Lymphedema, not elsewhere classified: Secondary | ICD-10-CM | POA: Diagnosis not present

## 2019-11-18 DIAGNOSIS — Z20828 Contact with and (suspected) exposure to other viral communicable diseases: Secondary | ICD-10-CM | POA: Diagnosis not present

## 2019-11-18 DIAGNOSIS — E039 Hypothyroidism, unspecified: Secondary | ICD-10-CM | POA: Diagnosis not present

## 2019-11-18 DIAGNOSIS — R0789 Other chest pain: Secondary | ICD-10-CM | POA: Diagnosis not present

## 2019-11-18 DIAGNOSIS — F419 Anxiety disorder, unspecified: Secondary | ICD-10-CM | POA: Diagnosis not present

## 2019-11-18 DIAGNOSIS — K219 Gastro-esophageal reflux disease without esophagitis: Secondary | ICD-10-CM | POA: Diagnosis not present

## 2019-11-18 DIAGNOSIS — F1190 Opioid use, unspecified, uncomplicated: Secondary | ICD-10-CM | POA: Diagnosis not present

## 2019-11-18 DIAGNOSIS — Z853 Personal history of malignant neoplasm of breast: Secondary | ICD-10-CM | POA: Diagnosis not present

## 2019-11-18 DIAGNOSIS — G629 Polyneuropathy, unspecified: Secondary | ICD-10-CM | POA: Diagnosis not present

## 2019-11-18 DIAGNOSIS — E871 Hypo-osmolality and hyponatremia: Secondary | ICD-10-CM | POA: Diagnosis not present

## 2019-11-18 DIAGNOSIS — G893 Neoplasm related pain (acute) (chronic): Secondary | ICD-10-CM | POA: Diagnosis not present

## 2019-11-18 DIAGNOSIS — I10 Essential (primary) hypertension: Secondary | ICD-10-CM | POA: Diagnosis not present

## 2019-11-18 DIAGNOSIS — Z9221 Personal history of antineoplastic chemotherapy: Secondary | ICD-10-CM | POA: Diagnosis not present

## 2019-11-19 DIAGNOSIS — G629 Polyneuropathy, unspecified: Secondary | ICD-10-CM | POA: Diagnosis not present

## 2019-11-19 DIAGNOSIS — E871 Hypo-osmolality and hyponatremia: Secondary | ICD-10-CM | POA: Diagnosis not present

## 2019-11-19 DIAGNOSIS — R0789 Other chest pain: Secondary | ICD-10-CM | POA: Diagnosis not present

## 2019-11-19 DIAGNOSIS — F419 Anxiety disorder, unspecified: Secondary | ICD-10-CM | POA: Diagnosis not present

## 2019-11-19 DIAGNOSIS — Z20828 Contact with and (suspected) exposure to other viral communicable diseases: Secondary | ICD-10-CM | POA: Diagnosis not present

## 2019-11-19 DIAGNOSIS — J45909 Unspecified asthma, uncomplicated: Secondary | ICD-10-CM | POA: Diagnosis not present

## 2019-11-19 DIAGNOSIS — G893 Neoplasm related pain (acute) (chronic): Secondary | ICD-10-CM | POA: Diagnosis not present

## 2019-11-19 DIAGNOSIS — I890 Lymphedema, not elsewhere classified: Secondary | ICD-10-CM | POA: Diagnosis not present

## 2019-11-19 DIAGNOSIS — K219 Gastro-esophageal reflux disease without esophagitis: Secondary | ICD-10-CM | POA: Diagnosis not present

## 2019-11-19 DIAGNOSIS — Z923 Personal history of irradiation: Secondary | ICD-10-CM | POA: Diagnosis not present

## 2019-11-19 DIAGNOSIS — I10 Essential (primary) hypertension: Secondary | ICD-10-CM | POA: Diagnosis not present

## 2019-11-19 DIAGNOSIS — Z853 Personal history of malignant neoplasm of breast: Secondary | ICD-10-CM | POA: Diagnosis not present

## 2019-11-19 DIAGNOSIS — F1190 Opioid use, unspecified, uncomplicated: Secondary | ICD-10-CM | POA: Diagnosis not present

## 2019-11-19 DIAGNOSIS — F329 Major depressive disorder, single episode, unspecified: Secondary | ICD-10-CM | POA: Diagnosis not present

## 2019-11-19 DIAGNOSIS — Z9221 Personal history of antineoplastic chemotherapy: Secondary | ICD-10-CM | POA: Diagnosis not present

## 2019-11-19 DIAGNOSIS — E039 Hypothyroidism, unspecified: Secondary | ICD-10-CM | POA: Diagnosis not present

## 2019-11-25 DIAGNOSIS — I890 Lymphedema, not elsewhere classified: Secondary | ICD-10-CM | POA: Diagnosis not present

## 2019-11-25 DIAGNOSIS — R222 Localized swelling, mass and lump, trunk: Secondary | ICD-10-CM | POA: Diagnosis not present

## 2019-11-25 DIAGNOSIS — C792 Secondary malignant neoplasm of skin: Secondary | ICD-10-CM | POA: Diagnosis not present

## 2019-11-25 DIAGNOSIS — M25511 Pain in right shoulder: Secondary | ICD-10-CM | POA: Diagnosis not present

## 2019-11-25 DIAGNOSIS — E860 Dehydration: Secondary | ICD-10-CM | POA: Diagnosis not present

## 2019-11-25 DIAGNOSIS — C7951 Secondary malignant neoplasm of bone: Secondary | ICD-10-CM | POA: Diagnosis not present

## 2019-11-25 DIAGNOSIS — Z79891 Long term (current) use of opiate analgesic: Secondary | ICD-10-CM | POA: Diagnosis not present

## 2019-11-25 DIAGNOSIS — R9431 Abnormal electrocardiogram [ECG] [EKG]: Secondary | ICD-10-CM | POA: Diagnosis not present

## 2019-11-25 DIAGNOSIS — Z79899 Other long term (current) drug therapy: Secondary | ICD-10-CM | POA: Diagnosis not present

## 2019-11-25 DIAGNOSIS — Z170 Estrogen receptor positive status [ER+]: Secondary | ICD-10-CM | POA: Diagnosis not present

## 2019-11-25 DIAGNOSIS — Z923 Personal history of irradiation: Secondary | ICD-10-CM | POA: Diagnosis not present

## 2019-11-25 DIAGNOSIS — C50912 Malignant neoplasm of unspecified site of left female breast: Secondary | ICD-10-CM | POA: Diagnosis not present

## 2019-11-25 DIAGNOSIS — R5381 Other malaise: Secondary | ICD-10-CM | POA: Diagnosis not present

## 2019-11-25 DIAGNOSIS — M7989 Other specified soft tissue disorders: Secondary | ICD-10-CM | POA: Diagnosis not present

## 2019-11-25 DIAGNOSIS — I10 Essential (primary) hypertension: Secondary | ICD-10-CM | POA: Diagnosis not present

## 2020-01-05 DEATH — deceased
